# Patient Record
Sex: Male | Born: 1940
Health system: Southern US, Community
[De-identification: ages and names within clinical notes are randomized; demographics above are authoritative.]

---

## 2019-10-17 DIAGNOSIS — H903 Sensorineural hearing loss, bilateral: Secondary | ICD-10-CM | POA: Diagnosis not present

## 2019-11-02 DIAGNOSIS — E785 Hyperlipidemia, unspecified: Secondary | ICD-10-CM | POA: Diagnosis not present

## 2019-11-02 DIAGNOSIS — N1831 Chronic kidney disease, stage 3a: Secondary | ICD-10-CM | POA: Diagnosis not present

## 2019-11-02 DIAGNOSIS — Z125 Encounter for screening for malignant neoplasm of prostate: Secondary | ICD-10-CM | POA: Diagnosis not present

## 2019-11-02 DIAGNOSIS — R739 Hyperglycemia, unspecified: Secondary | ICD-10-CM | POA: Diagnosis not present

## 2019-11-13 DIAGNOSIS — I10 Essential (primary) hypertension: Secondary | ICD-10-CM | POA: Diagnosis not present

## 2019-11-13 DIAGNOSIS — R079 Chest pain, unspecified: Secondary | ICD-10-CM | POA: Diagnosis not present

## 2019-11-13 DIAGNOSIS — E785 Hyperlipidemia, unspecified: Secondary | ICD-10-CM | POA: Diagnosis not present

## 2020-04-03 DIAGNOSIS — N1831 Chronic kidney disease, stage 3a: Secondary | ICD-10-CM | POA: Diagnosis not present

## 2020-04-03 DIAGNOSIS — E785 Hyperlipidemia, unspecified: Secondary | ICD-10-CM | POA: Diagnosis not present

## 2020-04-03 DIAGNOSIS — R739 Hyperglycemia, unspecified: Secondary | ICD-10-CM | POA: Diagnosis not present

## 2020-04-17 DIAGNOSIS — N1831 Chronic kidney disease, stage 3a: Secondary | ICD-10-CM | POA: Diagnosis not present

## 2020-04-17 DIAGNOSIS — Z125 Encounter for screening for malignant neoplasm of prostate: Secondary | ICD-10-CM | POA: Diagnosis not present

## 2020-04-17 DIAGNOSIS — E785 Hyperlipidemia, unspecified: Secondary | ICD-10-CM | POA: Diagnosis not present

## 2020-04-17 DIAGNOSIS — I129 Hypertensive chronic kidney disease with stage 1 through stage 4 chronic kidney disease, or unspecified chronic kidney disease: Secondary | ICD-10-CM | POA: Diagnosis not present

## 2020-04-17 DIAGNOSIS — R739 Hyperglycemia, unspecified: Secondary | ICD-10-CM | POA: Diagnosis not present

## 2020-05-21 DIAGNOSIS — E039 Hypothyroidism, unspecified: Secondary | ICD-10-CM | POA: Diagnosis not present

## 2020-08-12 DIAGNOSIS — Z0100 Encounter for examination of eyes and vision without abnormal findings: Secondary | ICD-10-CM | POA: Diagnosis not present

## 2020-08-12 DIAGNOSIS — H2513 Age-related nuclear cataract, bilateral: Secondary | ICD-10-CM | POA: Diagnosis not present

## 2020-10-08 DIAGNOSIS — I10 Essential (primary) hypertension: Secondary | ICD-10-CM | POA: Diagnosis not present

## 2020-10-08 DIAGNOSIS — E785 Hyperlipidemia, unspecified: Secondary | ICD-10-CM | POA: Diagnosis not present

## 2020-10-08 DIAGNOSIS — N1831 Chronic kidney disease, stage 3a: Secondary | ICD-10-CM | POA: Diagnosis not present

## 2020-10-08 DIAGNOSIS — K219 Gastro-esophageal reflux disease without esophagitis: Secondary | ICD-10-CM | POA: Diagnosis not present

## 2020-10-08 DIAGNOSIS — R739 Hyperglycemia, unspecified: Secondary | ICD-10-CM | POA: Diagnosis not present

## 2020-10-15 DIAGNOSIS — I129 Hypertensive chronic kidney disease with stage 1 through stage 4 chronic kidney disease, or unspecified chronic kidney disease: Secondary | ICD-10-CM | POA: Diagnosis not present

## 2020-10-15 DIAGNOSIS — Z23 Encounter for immunization: Secondary | ICD-10-CM | POA: Diagnosis not present

## 2020-10-15 DIAGNOSIS — Z125 Encounter for screening for malignant neoplasm of prostate: Secondary | ICD-10-CM | POA: Diagnosis not present

## 2020-10-15 DIAGNOSIS — Z0000 Encounter for general adult medical examination without abnormal findings: Secondary | ICD-10-CM | POA: Diagnosis not present

## 2020-10-15 DIAGNOSIS — E785 Hyperlipidemia, unspecified: Secondary | ICD-10-CM | POA: Diagnosis not present

## 2020-10-15 DIAGNOSIS — N1831 Chronic kidney disease, stage 3a: Secondary | ICD-10-CM | POA: Diagnosis not present

## 2020-10-15 DIAGNOSIS — R739 Hyperglycemia, unspecified: Secondary | ICD-10-CM | POA: Diagnosis not present

## 2020-10-21 DIAGNOSIS — H903 Sensorineural hearing loss, bilateral: Secondary | ICD-10-CM | POA: Diagnosis not present

## 2020-11-05 DIAGNOSIS — I10 Essential (primary) hypertension: Secondary | ICD-10-CM | POA: Diagnosis not present

## 2020-11-05 DIAGNOSIS — E039 Hypothyroidism, unspecified: Secondary | ICD-10-CM | POA: Diagnosis not present

## 2021-01-11 DIAGNOSIS — Z20822 Contact with and (suspected) exposure to covid-19: Secondary | ICD-10-CM | POA: Diagnosis not present

## 2021-01-13 DIAGNOSIS — U071 COVID-19: Secondary | ICD-10-CM | POA: Diagnosis not present

## 2021-02-16 ENCOUNTER — Other Ambulatory Visit: Payer: Self-pay

## 2021-02-16 ENCOUNTER — Ambulatory Visit: Payer: Medicare HMO | Admitting: Dermatology

## 2021-02-16 ENCOUNTER — Encounter: Payer: Self-pay | Admitting: Dermatology

## 2021-02-16 DIAGNOSIS — D1800 Hemangioma unspecified site: Secondary | ICD-10-CM | POA: Diagnosis not present

## 2021-02-16 DIAGNOSIS — L821 Other seborrheic keratosis: Secondary | ICD-10-CM | POA: Diagnosis not present

## 2021-02-16 DIAGNOSIS — L918 Other hypertrophic disorders of the skin: Secondary | ICD-10-CM | POA: Diagnosis not present

## 2021-02-16 DIAGNOSIS — L814 Other melanin hyperpigmentation: Secondary | ICD-10-CM | POA: Diagnosis not present

## 2021-02-16 DIAGNOSIS — D229 Melanocytic nevi, unspecified: Secondary | ICD-10-CM | POA: Diagnosis not present

## 2021-02-16 DIAGNOSIS — Z1283 Encounter for screening for malignant neoplasm of skin: Secondary | ICD-10-CM

## 2021-02-16 DIAGNOSIS — L570 Actinic keratosis: Secondary | ICD-10-CM | POA: Diagnosis not present

## 2021-02-16 DIAGNOSIS — L578 Other skin changes due to chronic exposure to nonionizing radiation: Secondary | ICD-10-CM | POA: Diagnosis not present

## 2021-02-16 NOTE — Patient Instructions (Addendum)

## 2021-02-16 NOTE — Progress Notes (Signed)
   New Patient Visit  Subjective  Ronald Jacobson is a 80 y.o. male who presents for the following: TBSE (Patient here for full body skin exam and skin cancer screening. Patient does complain of some flaking at top of scalp and has used rx creams in the past but does not remember which ones. Patient does not have a hx of skin cancer. ). The patient presents for Total-Body Skin Exam (TBSE) for skin cancer screening and mole check.  The following portions of the chart were reviewed this encounter and updated as appropriate:   Allergies  Meds  Problems  Med Hx  Surg Hx  Fam Hx     Review of Systems:  No other skin or systemic complaints except as noted in HPI or Assessment and Plan.  Objective  Well appearing patient in no apparent distress; mood and affect are within normal limits.  A full examination was performed including scalp, head, eyes, ears, nose, lips, neck, chest, axillae, abdomen, back, buttocks, bilateral upper extremities, bilateral lower extremities, hands, feet, fingers, toes, fingernails, and toenails. All findings within normal limits unless otherwise noted below.  Scalp (18) Erythematous thin papules/macules with gritty scale.    Assessment & Plan  AK (actinic keratosis) (18) Scalp  Destruction of lesion - Scalp Complexity: simple   Destruction method: cryotherapy   Informed consent: discussed and consent obtained   Timeout:  patient name, date of birth, surgical site, and procedure verified Lesion destroyed using liquid nitrogen: Yes   Region frozen until ice ball extended beyond lesion: Yes   Outcome: patient tolerated procedure well with no complications   Post-procedure details: wound care instructions given    Skin cancer screening  Lentigines - Scattered tan macules - Due to sun exposure - Benign-appearing, observe - Recommend daily broad spectrum sunscreen SPF 30+ to sun-exposed areas, reapply every 2 hours as needed. - Call for any  changes  Seborrheic Keratoses - Stuck-on, waxy, tan-brown papules and/or plaques  - Benign-appearing - Discussed benign etiology and prognosis. - Observe - Call for any changes  Melanocytic Nevi - Tan-brown and/or pink-flesh-colored symmetric macules and papules - Benign appearing on exam today - Observation - Call clinic for new or changing moles - Recommend daily use of broad spectrum spf 30+ sunscreen to sun-exposed areas.   Hemangiomas - Red papules - Discussed benign nature - Observe - Call for any changes  Actinic Damage - Chronic condition, secondary to cumulative UV/sun exposure - diffuse scaly erythematous macules with underlying dyspigmentation - Recommend daily broad spectrum sunscreen SPF 30+ to sun-exposed areas, reapply every 2 hours as needed.  - Staying in the shade or wearing long sleeves, sun glasses (UVA+UVB protection) and wide brim hats (4-inch brim around the entire circumference of the hat) are also recommended for sun protection.  - Call for new or changing lesions.  Skin cancer screening performed today.  Acrochordons (Skin Tags) - Fleshy, skin-colored pedunculated papules - Benign appearing.  - Observe. - If desired, they can be removed with an in office procedure that is not covered by insurance. - Please call the clinic if you notice any new or changing lesions.  Return in about 6 months (around 08/16/2021) for AK follow up.  Graciella Belton, RMA, am acting as scribe for Sarina Ser, MD . Documentation: I have reviewed the above documentation for accuracy and completeness, and I agree with the above.  Sarina Ser, MD

## 2021-04-14 ENCOUNTER — Other Ambulatory Visit: Payer: Self-pay

## 2021-04-14 ENCOUNTER — Ambulatory Visit: Payer: Medicare HMO | Admitting: Nurse Practitioner

## 2021-04-14 VITALS — BP 140/80 | HR 75 | Temp 98.6°F | Ht 71.0 in | Wt 231.0 lb

## 2021-04-14 DIAGNOSIS — R051 Acute cough: Secondary | ICD-10-CM | POA: Diagnosis not present

## 2021-04-14 NOTE — Patient Instructions (Addendum)
Resume tussin DM-- increase hydrate  Chest xray   [facility] Address: [address] [place], [place], [place] [address] Phone: [phone]

## 2021-04-14 NOTE — Progress Notes (Signed)
Careteam: Patient Care Team: Adin Hector, MD as PCP - General (Internal Medicine)  Advanced Directive information    Allergies  Allergen Reactions   Nitrous Oxide Anxiety    Chief Complaint  Patient presents with   Acute Visit    Patient complains of cough for about 5-6 days. Patient took COVID test about 5 days ago that was negative. Patient bringing up a little phlegm. He has been taking Tussin DM. Cough has gotten a little better. No other respiratory symptoms. Deniw having any fever.     HPI: Patient is a 81 y.o. male seen in today at the [facility] for cough for 4-5 days.  Has been taking tussin DM 12 hour liquid for several days which has helped but still ongoing.  Has a little phlegm.  COVID test negative.  Originally had body aches but they have improved.  Drinking plenty of water.  No shortness of breath.  Wife gave him a PM medication.  Wheezing at the end of his breaths. No hx of smoking, COPD or asthma.   Review of Systems:  Review of Systems  Constitutional:  Positive for malaise/fatigue. Negative for chills and fever.  Respiratory:  Positive for cough and wheezing. Negative for sputum production and shortness of breath.   Neurological:  Negative for dizziness and headaches.   No past medical history on file. No past surgical history on file. Social History:   has no history on file for tobacco use, alcohol use, and drug use.  No family history on file.  Medications: Patient's Medications  New Prescriptions   No medications on file  Previous Medications   AMLODIPINE (NORVASC) 5 MG TABLET       FAMOTIDINE (PEPCID) 40 MG TABLET    Take by mouth.   METOPROLOL SUCCINATE (TOPROL-XL) 50 MG 24 HR TABLET    Take 1 tablet by mouth daily.   OLMESARTAN (BENICAR) 40 MG TABLET    Take 1 tablet by mouth daily.   POTASSIUM CHLORIDE SA (KLOR-CON) 20 MEQ TABLET    Take 1 tablet by mouth 2 (two) times daily.   TRIAMTERENE-HYDROCHLOROTHIAZIDE  (MAXZIDE-25) 37.5-25 MG TABLET    Take 1 tablet by mouth daily.  Modified Medications   No medications on file  Discontinued Medications   No medications on file    Physical Exam:  Vitals:   04/14/21 1555  BP: 140/80  Pulse: 75  Temp: 98.6 F (37 C)  TempSrc: Oral  SpO2: 94%  Weight: 231 lb (104.8 kg)  Height: 5\' 11"  (1.803 m)   Body mass index is 32.22 kg/m. Wt Readings from Last 3 Encounters:  04/14/21 231 lb (104.8 kg)    Physical Exam Constitutional:      Appearance: Normal appearance.  Cardiovascular:     Rate and Rhythm: Normal rate and regular rhythm.  Pulmonary:     Effort: Pulmonary effort is normal.     Breath sounds: Examination of the right-upper field reveals wheezing. Examination of the left-lower field reveals rhonchi. Wheezing and rhonchi present.  Skin:    General: Skin is warm and dry.  Neurological:     Mental Status: He is alert.    Labs reviewed: Basic Metabolic Panel: No results for input(s): NA, K, CL, CO2, GLUCOSE, BUN, CREATININE, CALCIUM, MG, PHOS, TSH in the last 8760 hours. Liver Function Tests: No results for input(s): AST, ALT, ALKPHOS, BILITOT, PROT, ALBUMIN in the last 8760 hours. No results for input(s): LIPASE, AMYLASE in the last  8760 hours. No results for input(s): AMMONIA in the last 8760 hours. CBC: No results for input(s): WBC, NEUTROABS, HGB, HCT, MCV, PLT in the last 8760 hours. Lipid Panel: No results for input(s): CHOL, HDL, LDLCALC, TRIG, CHOLHDL, LDLDIRECT in the last 8760 hours. TSH: No results for input(s): TSH in the last 8760 hours. A1C: No results found for: HGBA1C   Assessment/Plan 1. Acute cough -to resume tussin DM by mouth twice daily with full glass of water -due to abnormal lung sounds will have him get chest xray.  -strict follow up precautions given.  - DG Chest 2 View; Future  Marquize Seib K. [place], [facility] [phone]

## 2021-04-15 ENCOUNTER — Ambulatory Visit
Admission: RE | Admit: 2021-04-15 | Discharge: 2021-04-15 | Disposition: A | Discharge status: Home / Self Care | Payer: Medicare HMO | Source: Ambulatory Visit | Attending: Nurse Practitioner | Admitting: Nurse Practitioner

## 2021-04-15 ENCOUNTER — Ambulatory Visit
Admission: RE | Admit: 2021-04-15 | Discharge: 2021-04-15 | Disposition: A | Discharge status: Home / Self Care | Payer: Medicare HMO | Attending: Nurse Practitioner | Admitting: Nurse Practitioner

## 2021-04-15 ENCOUNTER — Telehealth: Payer: Self-pay | Admitting: Nurse Practitioner

## 2021-04-15 ENCOUNTER — Other Ambulatory Visit: Payer: Self-pay

## 2021-04-15 DIAGNOSIS — R051 Acute cough: Secondary | ICD-10-CM | POA: Insufficient documentation

## 2021-04-15 DIAGNOSIS — J9811 Atelectasis: Secondary | ICD-10-CM | POA: Diagnosis not present

## 2021-04-15 DIAGNOSIS — R059 Cough, unspecified: Secondary | ICD-10-CM | POA: Diagnosis not present

## 2021-04-15 NOTE — Telephone Encounter (Signed)
Spoke with patient about results.

## 2021-04-15 NOTE — Telephone Encounter (Signed)
Pt called very persist about needing his results from chest Xray done 04/15/21. He & wife are leaving for trip on 04/17/21 for 10 days & needs to make arrangements if needed.  Would like for someone to contact him with results ASAP.  Please advise, Thank you Lattie Haw

## 2021-04-30 DIAGNOSIS — U071 COVID-19: Secondary | ICD-10-CM | POA: Diagnosis not present

## 2021-04-30 DIAGNOSIS — Z03818 Encounter for observation for suspected exposure to other biological agents ruled out: Secondary | ICD-10-CM | POA: Diagnosis not present

## 2021-05-07 DIAGNOSIS — N1831 Chronic kidney disease, stage 3a: Secondary | ICD-10-CM | POA: Diagnosis not present

## 2021-05-07 DIAGNOSIS — E785 Hyperlipidemia, unspecified: Secondary | ICD-10-CM | POA: Diagnosis not present

## 2021-05-07 DIAGNOSIS — R739 Hyperglycemia, unspecified: Secondary | ICD-10-CM | POA: Diagnosis not present

## 2021-05-07 DIAGNOSIS — Z125 Encounter for screening for malignant neoplasm of prostate: Secondary | ICD-10-CM | POA: Diagnosis not present

## 2021-05-07 DIAGNOSIS — K219 Gastro-esophageal reflux disease without esophagitis: Secondary | ICD-10-CM | POA: Diagnosis not present

## 2021-05-07 DIAGNOSIS — I10 Essential (primary) hypertension: Secondary | ICD-10-CM | POA: Diagnosis not present

## 2021-05-13 DIAGNOSIS — E785 Hyperlipidemia, unspecified: Secondary | ICD-10-CM | POA: Diagnosis not present

## 2021-05-13 DIAGNOSIS — I129 Hypertensive chronic kidney disease with stage 1 through stage 4 chronic kidney disease, or unspecified chronic kidney disease: Secondary | ICD-10-CM | POA: Diagnosis not present

## 2021-05-13 DIAGNOSIS — N1831 Chronic kidney disease, stage 3a: Secondary | ICD-10-CM | POA: Diagnosis not present

## 2021-05-13 DIAGNOSIS — R739 Hyperglycemia, unspecified: Secondary | ICD-10-CM | POA: Diagnosis not present

## 2021-08-13 DIAGNOSIS — H43813 Vitreous degeneration, bilateral: Secondary | ICD-10-CM | POA: Diagnosis not present

## 2021-08-13 DIAGNOSIS — Z0100 Encounter for examination of eyes and vision without abnormal findings: Secondary | ICD-10-CM | POA: Diagnosis not present

## 2021-08-19 ENCOUNTER — Encounter: Payer: Self-pay | Admitting: Dermatology

## 2021-08-19 ENCOUNTER — Ambulatory Visit: Payer: Medicare HMO | Admitting: Dermatology

## 2021-08-19 DIAGNOSIS — L578 Other skin changes due to chronic exposure to nonionizing radiation: Secondary | ICD-10-CM | POA: Diagnosis not present

## 2021-08-19 DIAGNOSIS — L570 Actinic keratosis: Secondary | ICD-10-CM | POA: Diagnosis not present

## 2021-08-19 MED ORDER — FLUOROURACIL 5 % EX CREA: TOPICAL_CREAM | CUTANEOUS | 1 refills | Status: AC

## 2021-08-19 NOTE — Patient Instructions (Signed)
Cryotherapy Aftercare ? ?Wash gently with soap and water everyday.   ?Apply Vaseline and Band-Aid daily until healed.  ? ?Prior to procedure, discussed risks of blister formation, small wound, skin dyspigmentation, or rare scar following cryotherapy. Recommend Vaseline ointment to treated areas while healing.  ? ? ?Start 5-fluorouracil/calcipotriene cream twice a day for 7 days to affected areas including scalp. Prescription sent to [facility] Patient provided with contact information for pharmacy and advised the pharmacy will mail the prescription to their home. Patient provided with handout reviewing treatment course and side effects and advised to call or message [place] on MyChart with any concerns. ? ?5-Fluorouracil/Calcipotriene Patient Education  ? ?Actinic keratoses are the dry, red scaly spots on the skin caused by sun damage. A portion of these spots can turn into skin cancer with time, and treating them can help prevent development of skin cancer.  ? ?Treatment of these spots requires removal of the defective skin cells. There are various ways to remove actinic keratoses, including freezing with liquid nitrogen, treatment with creams, or treatment with a blue light procedure in the office.  ? ?5-fluorouracil cream is a topical cream used to treat actinic keratoses. It works by interfering with the growth of abnormal fast-growing skin cells, such as actinic keratoses. These cells peel off and are replaced by healthy ones.  ? ?5-fluorouracil/calcipotriene is a combination of the 5-fluorouracil cream with a vitamin D analog cream called calcipotriene. The calcipotriene alone does not treat actinic keratoses. However, when it is combined with 5-fluorouracil, it helps the 5-fluorouracil treat the actinic keratoses much faster so that the same results can be achieved with a much shorter treatment time. ? ?INSTRUCTIONS FOR 5-FLUOROURACIL/CALCIPOTRIENE CREAM:  ? ?5-fluorouracil/calcipotriene cream typically  only needs to be used for 4-7 days. A thin layer should be applied twice a day to the treatment areas recommended by your physician.  ? ?If your physician prescribed you separate tubes of 5-fluourouracil and calcipotriene, apply a thin layer of 5-fluorouracil followed by a thin layer of calcipotriene.  ? ?Avoid contact with your eyes, nostrils, and mouth. Do not use 5-fluorouracil/calcipotriene cream on infected or open wounds.  ? ?You will develop redness, irritation and some crusting at areas where you have pre-cancer damage/actinic keratoses. IF YOU DEVELOP PAIN, BLEEDING, OR SIGNIFICANT CRUSTING, STOP THE TREATMENT EARLY - you have already gotten a good response and the actinic keratoses should clear up well. ? ?Wash your hands after applying 5-fluorouracil 5% cream on your skin.  ? ?A moisturizer or sunscreen with a minimum SPF 30 should be applied each morning.  ? ?Once you have finished the treatment, you can apply a thin layer of Vaseline twice a day to irritated areas to soothe and calm the areas more quickly. If you experience significant discomfort, contact your physician. ? ?For some patients it is necessary to repeat the treatment for best results. ? ?SIDE EFFECTS: When using 5-fluorouracil/calcipotriene cream, you may have mild irritation, such as redness, dryness, swelling, or a mild burning sensation. This usually resolves within 2 weeks. The more actinic keratoses you have, the more redness and inflammation you can expect during treatment. Eye irritation has been reported rarely. If this occurs, please let us know.  ?If you have any trouble using this cream, please call the office. If you have any other questions about this information, please do not hesitate to ask me before you leave the office. ? ? ?If You Need Anything After Your Visit ? ?If you have any questions  or concerns for your doctor, please call our main line at [phone] and press option 4 to reach your doctor's medical assistant.  If no one answers, please leave a voicemail as directed and we will return your call as soon as possible. Messages left after 4 pm will be answered the following business day.  ? ?You may also send [place] a message via MyChart. We typically respond to MyChart messages within 1-2 business days. ? ?For prescription refills, please ask your pharmacy to contact our office. Our fax number is [phone]. ? ?If you have an urgent issue when the clinic is closed that cannot wait until the next business day, you can page your doctor at the number below.   ? ?Please note that while we do our best to be available for urgent issues outside of office hours, we are not available 24/7.  ? ?If you have an urgent issue and are unable to reach [place], you may choose to seek medical care at your doctor's office, retail clinic, urgent care center, or emergency room. ? ?If you have a medical emergency, please immediately call 911 or go to the emergency department. ? ?Pager Numbers ? ?- Dr. Nehemiah Massed: [phone] ? ?- Dr. Laurence Ferrari: [phone] ? ?- Dr. Nicole Kindred: [phone] ? ?In the event of inclement weather, please call our main line at [phone] for an update on the status of any delays or closures. ? ?Dermatology Medication Tips: ?Please keep the boxes that topical medications come in in order to help keep track of the instructions about where and how to use these. Pharmacies typically print the medication instructions only on the boxes and not directly on the medication tubes.  ? ?If your medication is too expensive, please contact our office at [phone] option 4 or send [place] a message through [place].  ? ?We are unable to tell what your co-pay for medications will be in advance as this is different depending on your insurance coverage. However, we may be able to find a substitute medication at lower cost or fill out paperwork to get insurance to cover a needed medication.  ? ?If a prior authorization is required to get your medication  covered by your insurance company, please allow [place] 1-2 business days to complete this process. ? ?Drug prices often vary depending on where the prescription is filled and some pharmacies may offer cheaper prices. ? ?The website www.goodrx.com contains coupons for medications through different pharmacies. The prices here do not account for what the cost may be with help from insurance (it may be cheaper with your insurance), but the website can give you the price if you did not use any insurance.  ?- You can print the associated coupon and take it with your prescription to the pharmacy.  ?- You may also stop by our office during regular business hours and pick up a GoodRx coupon card.  ?- If you need your prescription sent electronically to a different pharmacy, notify our office through [facility] or by phone at [phone] option 4. ? ? ? ? ?Si Usted Necesita Algo Despu?s de Su Visita ? ?Tambi?n puede enviarnos un mensaje a trav?s de MyChart. Por lo general respondemos a los mensajes de MyChart en el transcurso de 1 a 2 d?as h?biles. ? ?Para renovar recetas, por favor pida a su farmacia que se ponga en contacto con nuestra oficina. Nuestro n?mero de fax es el [phone]. ? ?Si tiene un asunto urgente cuando la cl?nica est? cerrada y que no puede  esperar hasta el siguiente d?a h?bil, puede llamar/localizar a su doctor(a) al n?mero que aparece a continuaci?n.  ? ?Por favor, tenga en cuenta que aunque hacemos todo lo posible para estar disponibles para asuntos urgentes fuera del horario de oficina, no estamos disponibles las 24 horas del d?a, los 7 d?as de la semana.  ? ?Si tiene un problema urgente y no puede comunicarse con nosotros, puede optar por buscar atenci?n m?dica  en el consultorio de su doctor(a), en una cl?nica privada, en un centro de atenci?n urgente o en una sala de emergencias. ? ?Si tiene Engineer, maintenance (IT) m?dica, por favor llame inmediatamente al 911 o vaya a la sala de  emergencias. ? ?N?meros de b?per ? ?- Dr. Nehemiah Massed: [phone] ? ?- Dra. Moye: [phone] ? ?- Dra. Nicole Kindred: [phone] ? ?En caso de inclemencias del tiempo, por favor llame a nuestra l?nea principal al [phone]

## 2021-08-19 NOTE — Progress Notes (Signed)
Follow-Up Visit   Subjective  Ronald Jacobson is a 81 y.o. male who presents for the following: Actinic Keratosis (6 month recheck. Scalp. Tx with LN2 at last visit). The patient has spots, moles and lesions to be evaluated, some may be new or changing and the patient has concerns that these could be cancer.  The following portions of the chart were reviewed this encounter and updated as appropriate:  Allergies  Meds  Problems  Med Hx  Surg Hx  Fam Hx     Review of Systems: No other skin or systemic complaints except as noted in HPI or Assessment and Plan.  Objective  Well appearing patient in no apparent distress; mood and affect are within normal limits.  A focused examination was performed including head, including the scalp, face, neck, nose, ears, eyelids, and lips. Relevant physical exam findings are noted in the Assessment and Plan.  Scalp x3 (3) Erythematous thin papules/macules with gritty scale.    Assessment & Plan   Actinic Damage - Severe, confluent actinic changes with pre-cancerous actinic keratoses  - Severe, chronic, not at goal, secondary to cumulative UV radiation exposure over time - diffuse scaly erythematous macules and papules with underlying dyspigmentation - Discussed Prescription "Field Treatment" for Severe, Chronic Confluent Actinic Changes with Pre-Cancerous Actinic Keratoses Field treatment involves treatment of an entire area of skin that has confluent Actinic Changes (Sun/ Ultraviolet light damage) and PreCancerous Actinic Keratoses by method of PhotoDynamic Therapy (PDT) and/or prescription Topical Chemotherapy agents such as 5-fluorouracil, 5-fluorouracil/calcipotriene, and/or imiquimod.  The purpose is to decrease the number of clinically evident and subclinical PreCancerous lesions to prevent progression to development of skin cancer by chemically destroying early precancer changes that may or may not be visible.  It has been shown to reduce  the risk of developing skin cancer in the treated area. As a result of treatment, redness, scaling, crusting, and open sores may occur during treatment course. One or more than one of these methods may be used and may have to be used several times to control, suppress and eliminate the PreCancerous changes. Discussed treatment course, expected reaction, and possible side effects. - Recommend daily broad spectrum sunscreen SPF 30+ to sun-exposed areas, reapply every 2 hours as needed.  - Staying in the shade or wearing long sleeves, sun glasses (UVA+UVB protection) and wide brim hats (4-inch brim around the entire circumference of the hat) are also recommended. - Call for new or changing lesions.   Start 5-fluorouracil/calcipotriene cream twice a day for 7  days to affected areas including scalp. Prescription sent to [facility]. Patient provided with contact information for pharmacy and advised the pharmacy will mail the prescription to their home. Patient provided with handout reviewing treatment course and side effects and advised to call or message [place] on MyChart with any concerns.  AK (actinic keratosis) (3) Scalp x3  Actinic keratoses are precancerous spots that appear secondary to cumulative UV radiation exposure/sun exposure over time. They are chronic with expected duration over 1 year. A portion of actinic keratoses will progress to squamous cell carcinoma of the skin. It is not possible to reliably predict which spots will progress to skin cancer and so treatment is recommended to prevent development of skin cancer.  Recommend daily broad spectrum sunscreen SPF 30+ to sun-exposed areas, reapply every 2 hours as needed.  Recommend staying in the shade or wearing long sleeves, sun glasses (UVA+UVB protection) and wide brim hats (4-inch brim around the entire circumference  of the hat). Call for new or changing lesions.  Start 5-fluorouracil/calcipotriene cream twice a day for 7 days to  affected areas including scalp. Prescription sent to [facility]. Patient provided with contact information for pharmacy and advised the pharmacy will mail the prescription to their home. Patient provided with handout reviewing treatment course and side effects and advised to call or message [place] on MyChart with any concerns.   Destruction of lesion - Scalp x3 Complexity: simple   Destruction method: cryotherapy   Informed consent: discussed and consent obtained   Timeout:  patient name, date of birth, surgical site, and procedure verified Lesion destroyed using liquid nitrogen: Yes   Region frozen until ice ball extended beyond lesion: Yes   Outcome: patient tolerated procedure well with no complications   Post-procedure details: wound care instructions given    fluorouracil (EFUDEX) 5 % cream - Scalp x3 Apply twice a day for 7 days to affected areas including scalp.   Return in about 6 months (around 02/19/2022) for AK Follow Up.  I, Emelia Salisbury, CMA, am acting as scribe for Sarina Ser, MD. Documentation: I have reviewed the above documentation for accuracy and completeness, and I agree with the above.  Sarina Ser, MD

## 2021-09-01 ENCOUNTER — Encounter: Payer: Self-pay | Admitting: Dermatology

## 2021-11-04 DIAGNOSIS — N1831 Chronic kidney disease, stage 3a: Secondary | ICD-10-CM | POA: Diagnosis not present

## 2021-11-04 DIAGNOSIS — E785 Hyperlipidemia, unspecified: Secondary | ICD-10-CM | POA: Diagnosis not present

## 2021-11-04 DIAGNOSIS — R739 Hyperglycemia, unspecified: Secondary | ICD-10-CM | POA: Diagnosis not present

## 2021-11-11 DIAGNOSIS — N183 Chronic kidney disease, stage 3 unspecified: Secondary | ICD-10-CM | POA: Diagnosis not present

## 2021-11-11 DIAGNOSIS — Z1389 Encounter for screening for other disorder: Secondary | ICD-10-CM | POA: Diagnosis not present

## 2021-11-11 DIAGNOSIS — R7303 Prediabetes: Secondary | ICD-10-CM | POA: Diagnosis not present

## 2021-11-11 DIAGNOSIS — E785 Hyperlipidemia, unspecified: Secondary | ICD-10-CM | POA: Diagnosis not present

## 2021-11-11 DIAGNOSIS — Z0000 Encounter for general adult medical examination without abnormal findings: Secondary | ICD-10-CM | POA: Diagnosis not present

## 2021-11-11 DIAGNOSIS — I129 Hypertensive chronic kidney disease with stage 1 through stage 4 chronic kidney disease, or unspecified chronic kidney disease: Secondary | ICD-10-CM | POA: Diagnosis not present

## 2021-11-22 DIAGNOSIS — M25562 Pain in left knee: Secondary | ICD-10-CM | POA: Diagnosis not present

## 2022-02-26 DIAGNOSIS — I10 Essential (primary) hypertension: Secondary | ICD-10-CM | POA: Diagnosis not present

## 2022-02-26 DIAGNOSIS — M10071 Idiopathic gout, right ankle and foot: Secondary | ICD-10-CM | POA: Diagnosis not present

## 2022-03-15 ENCOUNTER — Ambulatory Visit: Payer: Medicare HMO | Admitting: Dermatology

## 2022-03-15 DIAGNOSIS — Z5111 Encounter for antineoplastic chemotherapy: Secondary | ICD-10-CM

## 2022-03-15 DIAGNOSIS — L570 Actinic keratosis: Secondary | ICD-10-CM | POA: Diagnosis not present

## 2022-03-15 DIAGNOSIS — L578 Other skin changes due to chronic exposure to nonionizing radiation: Secondary | ICD-10-CM

## 2022-03-15 DIAGNOSIS — Z79899 Other long term (current) drug therapy: Secondary | ICD-10-CM

## 2022-03-15 NOTE — Progress Notes (Signed)
Follow-Up Visit   Subjective  Ronald Jacobson is a 81 y.o. male who presents for the following: Actinic Keratosis (Hx of aks 6 month follow up. Patient use 5 f/u cream at scalp 6 months ago but still feels some rough areas at scalp.). The patient has spots, moles and lesions to be evaluated, some may be new or changing and the patient has concerns that these could be cancer.  The following portions of the chart were reviewed this encounter and updated as appropriate:  Allergies  Meds  Problems  Med Hx  Surg Hx  Fam Hx     Review of Systems: No other skin or systemic complaints except as noted in HPI or Assessment and Plan.  Objective  Well appearing patient in no apparent distress; mood and affect are within normal limits.  A focused examination was performed including face, chest . Relevant physical exam findings are noted in the Assessment and Plan.  Scalp x 5 (5) Erythematous thin papules/macules with gritty scale.    Assessment & Plan  Actinic keratosis (5) Scalp x 5  Actinic keratoses are precancerous spots that appear secondary to cumulative UV radiation exposure/sun exposure over time. They are chronic with expected duration over 1 year. A portion of actinic keratoses will progress to squamous cell carcinoma of the skin. It is not possible to reliably predict which spots will progress to skin cancer and so treatment is recommended to prevent development of skin cancer.  Recommend daily broad spectrum sunscreen SPF 30+ to sun-exposed areas, reapply every 2 hours as needed.  Recommend staying in the shade or wearing long sleeves, sun glasses (UVA+UVB protection) and wide brim hats (4-inch brim around the entire circumference of the hat). Call for new or changing lesions.  Recommend restarting 54fu cream to affected areas of scalp twice daily for 7 days.   [facility] rx sent to [facility]  Instructions for [facility] Medications  One or more of your medications  was sent to the [facility] mail order compounding pharmacy. You will receive an email from them and can purchase the medicine through that link. It will then be mailed to your home at the address you confirmed. If for any reason you do not receive an email from them, please check your spam folder. If you still do not find the email, please let [place]. [facility] phone number is [phone]   Destruction of lesion - Scalp x 5 Complexity: simple   Destruction method: cryotherapy   Informed consent: discussed and consent obtained   Timeout:  patient name, date of birth, surgical site, and procedure verified Lesion destroyed using liquid nitrogen: Yes   Region frozen until ice ball extended beyond lesion: Yes   Outcome: patient tolerated procedure well with no complications   Post-procedure details: wound care instructions given   Additional details:  Prior to procedure, discussed risks of blister formation, small wound, skin dyspigmentation, or rare scar following cryotherapy. Recommend Vaseline ointment to treated areas while healing.   Actinic Damage with PreCancerous Actinic Keratoses Counseling for Topical Chemotherapy Management: Patient exhibits: - Severe, confluent actinic changes with pre-cancerous actinic keratoses that is secondary to cumulative UV radiation exposure over time - Condition that is severe; chronic, not at goal. - diffuse scaly erythematous macules and papules with underlying dyspigmentation - Discussed Prescription "Field Treatment" topical Chemotherapy for Severe, Chronic Confluent Actinic Changes with Pre-Cancerous Actinic Keratoses Field treatment involves treatment of an entire area of skin that has confluent Actinic Changes (  Sun/ Ultraviolet light damage) and PreCancerous Actinic Keratoses by method of PhotoDynamic Therapy (PDT) and/or prescription Topical Chemotherapy agents such as 5-fluorouracil, 5-fluorouracil/calcipotriene, and/or imiquimod.  The  purpose is to decrease the number of clinically evident and subclinical PreCancerous lesions to prevent progression to development of skin cancer by chemically destroying early precancer changes that may or may not be visible.  It has been shown to reduce the risk of developing skin cancer in the treated area. As a result of treatment, redness, scaling, crusting, and open sores may occur during treatment course. One or more than one of these methods may be used and may have to be used several times to control, suppress and eliminate the PreCancerous changes. Discussed treatment course, expected reaction, and possible side effects. - Recommend daily broad spectrum sunscreen SPF 30+ to sun-exposed areas, reapply every 2 hours as needed.  - Staying in the shade or wearing long sleeves, sun glasses (UVA+UVB protection) and wide brim hats (4-inch brim around the entire circumference of the hat) are also recommended. - Call for new or changing lesions.  - Restart  5-fluorouracil/calcipotriene cream twice a day for 7 days to affected areas including scalp. Prescription sent to [facility]. Patient advised they will receive an email to purchase the medication online and have it sent to their home. Patient provided with handout reviewing treatment course and side effects and advised to call or message [place] on MyChart with any concerns.  Reviewed course of treatment and expected reaction.  Patient advised to expect inflammation and crusting and advised that erosions are possible.  Patient advised to be diligent with sun protection during and after treatment. Counseled to keep medication out of reach of children and pets.  Return in about 1 year (around 03/16/2023) for TBSE.  IRuthell Rummage, CMA, am acting as scribe for Sarina Ser, MD.. Documentation: I have reviewed the above documentation for accuracy and completeness, and I agree with the above.  Sarina Ser, MD

## 2022-03-15 NOTE — Patient Instructions (Addendum)
Restart cream in January    - Start 5-fluorouracil/calcipotriene cream twice a day for 7 days to affected areas including scalp. Prescription sent to [facility]. Patient advised they will receive an email to purchase the medication online and have it sent to their home. Patient provided with handout reviewing treatment course and side effects and advised to call or message [place] on MyChart with any concerns.  Reviewed course of treatment and expected reaction.  Patient advised to expect inflammation and crusting and advised that erosions are possible.  Patient advised to be diligent with sun protection during and after treatment. Counseled to keep medication out of reach of children and pets.   Instructions for [facility] Medications  One or more of your medications was sent to the [facility] mail order compounding pharmacy. You will receive an email from them and can purchase the medicine through that link. It will then be mailed to your home at the address you confirmed. If for any reason you do not receive an email from them, please check your spam folder. If you still do not find the email, please let us know. [facility] phone number is [phone].     5-Fluorouracil/Calcipotriene Patient Education   Actinic keratoses are the dry, red scaly spots on the skin caused by sun damage. A portion of these spots can turn into skin cancer with time, and treating them can help prevent development of skin cancer.   Treatment of these spots requires removal of the defective skin cells. There are various ways to remove actinic keratoses, including freezing with liquid nitrogen, treatment with creams, or treatment with a blue light procedure in the office.   5-fluorouracil cream is a topical cream used to treat actinic keratoses. It works by interfering with the growth of abnormal fast-growing skin cells, such as actinic keratoses. These cells peel off and are  replaced by healthy ones.   5-fluorouracil/calcipotriene is a combination of the 5-fluorouracil cream with a vitamin D analog cream called calcipotriene. The calcipotriene alone does not treat actinic keratoses. However, when it is combined with 5-fluorouracil, it helps the 5-fluorouracil treat the actinic keratoses much faster so that the same results can be achieved with a much shorter treatment time.  INSTRUCTIONS FOR 5-FLUOROURACIL/CALCIPOTRIENE CREAM:   5-fluorouracil/calcipotriene cream typically only needs to be used for 4-7 days. A thin layer should be applied twice a day to the treatment areas recommended by your physician.   If your physician prescribed you separate tubes of 5-fluourouracil and calcipotriene, apply a thin layer of 5-fluorouracil followed by a thin layer of calcipotriene.   Avoid contact with your eyes, nostrils, and mouth. Do not use 5-fluorouracil/calcipotriene cream on infected or open wounds.   You will develop redness, irritation and some crusting at areas where you have pre-cancer damage/actinic keratoses. IF YOU DEVELOP PAIN, BLEEDING, OR SIGNIFICANT CRUSTING, STOP THE TREATMENT EARLY - you have already gotten a good response and the actinic keratoses should clear up well.  Wash your hands after applying 5-fluorouracil 5% cream on your skin.   A moisturizer or sunscreen with a minimum SPF 30 should be applied each morning.   Once you have finished the treatment, you can apply a thin layer of Vaseline twice a day to irritated areas to soothe and calm the areas more quickly. If you experience significant discomfort, contact your physician.  For some patients it is necessary to repeat the treatment for best results.  SIDE EFFECTS: When using 5-fluorouracil/calcipotriene cream, you  may have mild irritation, such as redness, dryness, swelling, or a mild burning sensation. This usually resolves within 2 weeks. The more actinic keratoses you have, the more redness and  inflammation you can expect during treatment. Eye irritation has been reported rarely. If this occurs, please let us know.  If you have any trouble using this cream, please call the office. If you have any other questions about this information, please do not hesitate to ask me before you leave the office.       Actinic keratoses are precancerous spots that appear secondary to cumulative UV radiation exposure/sun exposure over time. They are chronic with expected duration over 1 year. A portion of actinic keratoses will progress to squamous cell carcinoma of the skin. It is not possible to reliably predict which spots will progress to skin cancer and so treatment is recommended to prevent development of skin cancer.  Recommend daily broad spectrum sunscreen SPF 30+ to sun-exposed areas, reapply every 2 hours as needed.  Recommend staying in the shade or wearing long sleeves, sun glasses (UVA+UVB protection) and wide brim hats (4-inch brim around the entire circumference of the hat). Call for new or changing lesions.   Cryotherapy Aftercare  Wash gently with soap and water everyday.   Apply Vaseline and Band-Aid daily until healed.   Due to recent changes in healthcare laws, you may see results of your pathology and/or laboratory studies on MyChart before the doctors have had a chance to review them. We understand that in some cases there may be results that are confusing or concerning to you. Please understand that not all results are received at the same time and often the doctors may need to interpret multiple results in order to provide you with the best plan of care or course of treatment. Therefore, we ask that you please give [place] 2 business days to thoroughly review all your results before contacting the office for clarification. Should we see a critical lab result, you will be contacted sooner.   If You Need Anything After Your Visit  If you have any questions or concerns for your  doctor, please call our main line at [phone] and press option 4 to reach your doctor's medical assistant. If no one answers, please leave a voicemail as directed and we will return your call as soon as possible. Messages left after 4 pm will be answered the following business day.   You may also send [place] a message via [place]. We typically respond to MyChart messages within 1-2 business days.  For prescription refills, please ask your pharmacy to contact our office. Our fax number is [phone].  If you have an urgent issue when the clinic is closed that cannot wait until the next business day, you can page your doctor at the number below.    Please note that while we do our best to be available for urgent issues outside of office hours, we are not available 24/7.   If you have an urgent issue and are unable to reach [place], you may choose to seek medical care at your doctor's office, retail clinic, urgent care center, or emergency room.  If you have a medical emergency, please immediately call 911 or go to the emergency department.  Pager Numbers  - Dr. Nehemiah Massed: [phone]  - Dr. Laurence Ferrari: [phone]  - Dr. Nicole Kindred: [phone]  In the event of inclement weather, please call our main line at [phone] for an update on the status of any delays or  closures.  Dermatology Medication Tips: Please keep the boxes that topical medications come in in order to help keep track of the instructions about where and how to use these. Pharmacies typically print the medication instructions only on the boxes and not directly on the medication tubes.   If your medication is too expensive, please contact our office at [phone] option 4 or send [place] a message through [place].   We are unable to tell what your co-pay for medications will be in advance as this is different depending on your insurance coverage. However, we may be able to find a substitute medication at lower cost or fill out paperwork  to get insurance to cover a needed medication.   If a prior authorization is required to get your medication covered by your insurance company, please allow [place] 1-2 business days to complete this process.  Drug prices often vary depending on where the prescription is filled and some pharmacies may offer cheaper prices.  The website www.goodrx.com contains coupons for medications through different pharmacies. The prices here do not account for what the cost may be with help from insurance (it may be cheaper with your insurance), but the website can give you the price if you did not use any insurance.  - You can print the associated coupon and take it with your prescription to the pharmacy.  - You may also stop by our office during regular business hours and pick up a GoodRx coupon card.  - If you need your prescription sent electronically to a different pharmacy, notify our office through [facility] or by phone at [phone] option 4.     Si Usted Necesita Algo Despus de Su Visita  Tambin puede enviarnos un mensaje a travs de Pharmacist, community. Por lo general respondemos a los mensajes de MyChart en el transcurso de 1 a 2 das hbiles.  Para renovar recetas, por favor pida a su farmacia que se ponga en contacto con nuestra oficina. Harland Dingwall de fax es [place] [phone].  Si tiene un asunto urgente cuando la clnica est cerrada y que no puede esperar hasta el siguiente da hbil, puede llamar/localizar a su doctor(a) al nmero que aparece a continuacin.   Por favor, tenga en cuenta que aunque hacemos todo lo posible para estar disponibles para asuntos urgentes fuera del horario de [place], no estamos disponibles las 24 horas del da, los 7 das de la [place].   Si tiene un problema urgente y no puede comunicarse con nosotros, puede optar por buscar atencin mdica  en el consultorio de su doctor(a), en una clnica privada, en un centro de atencin urgente o en una sala de  emergencias.  Si tiene Engineering geologist, por favor llame inmediatamente al 911 o vaya a la sala de emergencias.  Nmeros de bper  - Dr. Nehemiah Massed: [phone]  - Dra. Moye: [phone]  - Dra. Nicole Kindred: [phone]  En caso de inclemencias del [place], por favor llame a Johnsie Kindred principal al [phone] para una actualizacin sobre el [place] de cualquier retraso o cierre.  Consejos para la medicacin en dermatologa: Por favor, guarde las cajas en las que vienen los medicamentos de uso tpico para ayudarle a seguir las instrucciones sobre dnde y cmo usarlos. Las farmacias generalmente imprimen las instrucciones del medicamento slo en las cajas y no directamente en los tubos del [place].   Si su medicamento es muy caro, por favor, pngase en contacto con Zigmund Daniel llamando al [phone] y presione la opcin 4 o  envenos un mensaje a travs de MyChart.   No podemos decirle cul ser su copago por los medicamentos por adelantado ya que esto es diferente dependiendo de la cobertura de su seguro. Sin embargo, es posible que podamos encontrar un medicamento sustituto a Electrical engineer un formulario para que el seguro cubra el medicamento que se considera necesario.   Si se requiere una autorizacin previa para que su compaa de seguros [place] su medicamento, por favor permtanos de 1 a 2 das hbiles para completar este proceso.  Los precios de los medicamentos varan con frecuencia dependiendo del Environmental consultant de dnde se surte la receta y alguna farmacias pueden ofrecer precios ms baratos.  El sitio web www.goodrx.com tiene cupones para medicamentos de Airline pilot. Los precios aqu no tienen en cuenta lo que podra costar con la ayuda del seguro (puede ser ms barato con su seguro), pero el sitio web puede darle el precio si no utiliz Research scientist (physical sciences).  - Puede imprimir el cupn correspondiente y llevarlo con su receta a la farmacia.  - Tambin puede pasar por  nuestra oficina durante el horario de atencin regular y Charity fundraiser una tarjeta de cupones de GoodRx.  - Si necesita que su receta se enve electrnicamente a una farmacia diferente, informe a nuestra oficina a travs de MyChart de [place] o por telfono llamando al [phone] y presione la opcin 4.

## 2022-03-28 ENCOUNTER — Encounter: Payer: Self-pay | Admitting: Dermatology

## 2022-04-06 DIAGNOSIS — J069 Acute upper respiratory infection, unspecified: Secondary | ICD-10-CM | POA: Diagnosis not present

## 2022-05-10 DIAGNOSIS — R7303 Prediabetes: Secondary | ICD-10-CM | POA: Diagnosis not present

## 2022-05-10 DIAGNOSIS — K219 Gastro-esophageal reflux disease without esophagitis: Secondary | ICD-10-CM | POA: Diagnosis not present

## 2022-05-10 DIAGNOSIS — E785 Hyperlipidemia, unspecified: Secondary | ICD-10-CM | POA: Diagnosis not present

## 2022-05-10 DIAGNOSIS — N1831 Chronic kidney disease, stage 3a: Secondary | ICD-10-CM | POA: Diagnosis not present

## 2022-05-20 DIAGNOSIS — M10479 Other secondary gout, unspecified ankle and foot: Secondary | ICD-10-CM | POA: Diagnosis not present

## 2022-05-20 DIAGNOSIS — I129 Hypertensive chronic kidney disease with stage 1 through stage 4 chronic kidney disease, or unspecified chronic kidney disease: Secondary | ICD-10-CM | POA: Diagnosis not present

## 2022-05-20 DIAGNOSIS — Z125 Encounter for screening for malignant neoplasm of prostate: Secondary | ICD-10-CM | POA: Diagnosis not present

## 2022-05-20 DIAGNOSIS — E785 Hyperlipidemia, unspecified: Secondary | ICD-10-CM | POA: Diagnosis not present

## 2022-05-20 DIAGNOSIS — N183 Chronic kidney disease, stage 3 unspecified: Secondary | ICD-10-CM | POA: Diagnosis not present

## 2022-05-20 DIAGNOSIS — R7303 Prediabetes: Secondary | ICD-10-CM | POA: Diagnosis not present

## 2022-09-27 DIAGNOSIS — Z0100 Encounter for examination of eyes and vision without abnormal findings: Secondary | ICD-10-CM | POA: Diagnosis not present

## 2022-09-27 DIAGNOSIS — H2513 Age-related nuclear cataract, bilateral: Secondary | ICD-10-CM | POA: Diagnosis not present

## 2022-09-27 DIAGNOSIS — H43813 Vitreous degeneration, bilateral: Secondary | ICD-10-CM | POA: Diagnosis not present

## 2022-10-05 ENCOUNTER — Ambulatory Visit: Payer: Medicare HMO | Admitting: Dermatology

## 2022-10-05 VITALS — BP 131/79 | HR 82

## 2022-10-05 DIAGNOSIS — L578 Other skin changes due to chronic exposure to nonionizing radiation: Secondary | ICD-10-CM

## 2022-10-05 DIAGNOSIS — L570 Actinic keratosis: Secondary | ICD-10-CM

## 2022-10-05 DIAGNOSIS — L918 Other hypertrophic disorders of the skin: Secondary | ICD-10-CM | POA: Diagnosis not present

## 2022-10-05 DIAGNOSIS — D489 Neoplasm of uncertain behavior, unspecified: Secondary | ICD-10-CM

## 2022-10-05 DIAGNOSIS — W908XXA Exposure to other nonionizing radiation, initial encounter: Secondary | ICD-10-CM | POA: Diagnosis not present

## 2022-10-05 DIAGNOSIS — L919 Hypertrophic disorder of the skin, unspecified: Secondary | ICD-10-CM

## 2022-10-05 NOTE — Progress Notes (Signed)
Follow-Up Visit   Subjective  Ronald Jacobson is a 82 y.o. male who presents for the following: painful spot under left arm that has bothered him just a couple of days Patient has another spot at under right arm he would like checked.   The following portions of the chart were reviewed this encounter and updated as appropriate: medications, allergies, medical history  Review of Systems:  No other skin or systemic complaints except as noted in HPI or Assessment and Plan.  Objective  Well appearing patient in no apparent distress; mood and affect are within normal limits.   A focused examination was performed of the following areas: Left and right axilla    Relevant exam findings are noted in the Assessment and Plan.  Left Axilla 5 mm fleshy pink papule        Assessment & Plan    Acrochordons (Skin Tags) Right axilla  - Fleshy, skin-colored pedunculated papules - Benign appearing.  - Observe. - If desired, they can be removed with an in office procedure that is not covered by insurance. - Please call the clinic if you notice any new or changing lesions.  Neoplasm of uncertain behavior Left Axilla  Epidermal / dermal shaving  Lesion diameter (cm):  0.5 Informed consent: discussed and consent obtained   Patient was prepped and draped in usual sterile fashion: Area prepped with alcohol. Anesthesia: the lesion was anesthetized in a standard fashion   Anesthetic:  1% lidocaine w/ epinephrine 1-100,000 buffered w/ 8.4% NaHCO3 Instrument used: flexible razor blade   Hemostasis achieved with: pressure, aluminum chloride and electrodesiccation   Outcome: patient tolerated procedure well   Post-procedure details: wound care instructions given   Post-procedure details comment:  Ointment and small bandage applied.   Specimen 1 - Surgical pathology Differential Diagnosis: r/o isk vs irrated skin tag   Check Margins: No  R/o isk vs irritated skin tag    ACTINIC  DAMAGE WITH PRECANCEROUS ACTINIC KERATOSES Counseling for Topical Chemotherapy Management: Patient exhibits: - Severe, confluent actinic changes with pre-cancerous actinic keratoses that is secondary to cumulative UV radiation exposure over time - Condition that is severe; chronic, not at goal. - diffuse scaly erythematous macules and papules with underlying dyspigmentation - Discussed Prescription "Field Treatment" topical Chemotherapy for Severe, Chronic Confluent Actinic Changes with Pre-Cancerous Actinic Keratoses Field treatment involves treatment of an entire area of skin that has confluent Actinic Changes (Sun/ Ultraviolet light damage) and PreCancerous Actinic Keratoses by method of PhotoDynamic Therapy (PDT) and/or prescription Topical Chemotherapy agents such as 5-fluorouracil, 5-fluorouracil/calcipotriene, and/or imiquimod.  The purpose is to decrease the number of clinically evident and subclinical PreCancerous lesions to prevent progression to development of skin cancer by chemically destroying early precancer changes that may or may not be visible.  It has been shown to reduce the risk of developing skin cancer in the treated area. As a result of treatment, redness, scaling, crusting, and open sores may occur during treatment course. One or more than one of these methods may be used and may have to be used several times to control, suppress and eliminate the PreCancerous changes. Discussed treatment course, expected reaction, and possible side effects. - Recommend daily broad spectrum sunscreen SPF 30+ to sun-exposed areas, reapply every 2 hours as needed.  - Staying in the shade or wearing long sleeves, sun glasses (UVA+UVB protection) and wide brim hats (4-inch brim around the entire circumference of the hat) are also recommended. - Call for new or changing lesions. -  Pt has used in the past , and has some at home and he may repeat treatment in fall.  Return if symptoms worsen or fail to  improve.  I, Asher Muir, CMA, am acting as scribe for Willeen Niece, MD.   Documentation: I have reviewed the above documentation for accuracy and completeness, and I agree with the above.  Willeen Niece, MD

## 2022-10-05 NOTE — Patient Instructions (Addendum)
   Biopsy Wound Care Instructions  Leave the original bandage on for 24 hours if possible.  If the bandage becomes soaked or soiled before that time, it is OK to remove it and examine the wound.  A small amount of post-operative bleeding is normal.  If excessive bleeding occurs, remove the bandage, place gauze over the site and apply continuous pressure (no peeking) over the area for 30 minutes. If this does not work, please call our clinic as soon as possible or page your doctor if it is after hours.   Once a day, cleanse the wound with soap and water. It is fine to shower. If a thick crust develops you may use a Q-tip dipped into dilute hydrogen peroxide (mix 1:1 with water) to dissolve it.  Hydrogen peroxide can slow the healing process, so use it only as needed.    After washing, apply petroleum jelly (Vaseline) or an antibiotic ointment if your doctor prescribed one for you, followed by a bandage.    For best healing, the wound should be covered with a layer of ointment at all times. If you are not able to keep the area covered with a bandage to hold the ointment in place, this may mean re-applying the ointment several times a day.  Continue this wound care until the wound has healed and is no longer open.   Itching and mild discomfort is normal during the healing process. However, if you develop pain or severe itching, please call our office.   If you have any discomfort, you can take Tylenol (acetaminophen) or ibuprofen as directed on the bottle. (Please do not take these if you have an allergy to them or cannot take them for another reason).  Some redness, tenderness and white or yellow material in the wound is normal healing.  If the area becomes very sore and red, or develops a thick yellow-green material (pus), it may be infected; please notify us.    If you have stitches, return to clinic as directed to have the stitches removed. You will continue wound care for 2-3 days after the  stitches are removed.   Wound healing continues for up to one year following surgery. It is not unusual to experience pain in the scar from time to time during the interval.  If the pain becomes severe or the scar thickens, you should notify the office.    A slight amount of redness in a scar is expected for the first six months.  After six months, the redness will fade and the scar will soften and fade.  The color difference becomes less noticeable with time.  If there are any problems, return for a post-op surgery check at your earliest convenience.  To improve the appearance of the scar, you can use silicone scar gel, cream, or sheets (such as Mederma or Serica) every night for up to one year. These are available over the counter (without a prescription).  Please call our office at [phone] for any questions or concerns.     Due to recent changes in healthcare laws, you may see results of your pathology and/or laboratory studies on MyChart before the doctors have had a chance to review them. We understand that in some cases there may be results that are confusing or concerning to you. Please understand that not all results are received at the same time and often the doctors may need to interpret multiple results in order to provide you with the best plan of   care or course of treatment. Therefore, we ask that you please give us 2 business days to thoroughly review all your results before contacting the office for clarification. Should we see a critical lab result, you will be contacted sooner.   If You Need Anything After Your Visit  If you have any questions or concerns for your doctor, please call our main line at [phone] and press option 4 to reach your doctor's medical assistant. If no one answers, please leave a voicemail as directed and we will return your call as soon as possible. Messages left after 4 pm will be answered the following business day.   You may also send us a  message via MyChart. We typically respond to MyChart messages within 1-2 business days.  For prescription refills, please ask your pharmacy to contact our office. Our fax number is [phone].  If you have an urgent issue when the clinic is closed that cannot wait until the next business day, you can page your doctor at the number below.    Please note that while we do our best to be available for urgent issues outside of office hours, we are not available 24/7.   If you have an urgent issue and are unable to reach us, you may choose to seek medical care at your doctor's office, retail clinic, urgent care center, or emergency room.  If you have a medical emergency, please immediately call 911 or go to the emergency department.  Pager Numbers  - Dr. Kowalski: [phone]  - Dr. Moye: [phone]  - Dr. Stewart: [phone]  In the event of inclement weather, please call our main line at [phone] for an update on the status of any delays or closures.  Dermatology Medication Tips: Please keep the boxes that topical medications come in in order to help keep track of the instructions about where and how to use these. Pharmacies typically print the medication instructions only on the boxes and not directly on the medication tubes.   If your medication is too expensive, please contact our office at [phone] option 4 or send us a message through MyChart.   We are unable to tell what your co-pay for medications will be in advance as this is different depending on your insurance coverage. However, we may be able to find a substitute medication at lower cost or fill out paperwork to get insurance to cover a needed medication.   If a prior authorization is required to get your medication covered by your insurance company, please allow us 1-2 business days to complete this process.  Drug prices often vary depending on where the prescription is filled and some pharmacies may offer  cheaper prices.  The website www.goodrx.com contains coupons for medications through different pharmacies. The prices here do not account for what the cost may be with help from insurance (it may be cheaper with your insurance), but the website can give you the price if you did not use any insurance.  - You can print the associated coupon and take it with your prescription to the pharmacy.  - You may also stop by our office during regular business hours and pick up a GoodRx coupon card.  - If you need your prescription sent electronically to a different pharmacy, notify our office through [facility] MyChart or by phone at [phone] option 4.     Si Usted Necesita Algo Despus de Su Visita  Tambin puede enviarnos un mensaje a travs de MyChart. Por lo general   respondemos a los mensajes de MyChart en el transcurso de 1 a 2 das hbiles.  Para renovar recetas, por favor pida a su farmacia que se ponga en contacto con nuestra oficina. Nuestro nmero de fax es el [phone].  Si tiene un asunto urgente cuando la clnica est cerrada y que no puede esperar hasta el siguiente da hbil, puede llamar/localizar a su doctor(a) al nmero que aparece a continuacin.   Por favor, tenga en cuenta que aunque hacemos todo lo posible para estar disponibles para asuntos urgentes fuera del horario de oficina, no estamos disponibles las 24 horas del da, los 7 das de la semana.   Si tiene un problema urgente y no puede comunicarse con nosotros, puede optar por buscar atencin mdica  en el consultorio de su doctor(a), en una clnica privada, en un centro de atencin urgente o en una sala de emergencias.  Si tiene una emergencia mdica, por favor llame inmediatamente al 911 o vaya a la sala de emergencias.  Nmeros de bper  - Dr. Kowalski: [phone]  - Dra. Moye: [phone]  - Dra. Stewart: [phone]  En caso de inclemencias del tiempo, por favor llame a nuestra lnea principal al  [phone] para una actualizacin sobre el estado de cualquier retraso o cierre.  Consejos para la medicacin en dermatologa: Por favor, guarde las cajas en las que vienen los medicamentos de uso tpico para ayudarle a seguir las instrucciones sobre dnde y cmo usarlos. Las farmacias generalmente imprimen las instrucciones del medicamento slo en las cajas y no directamente en los tubos del medicamento.   Si su medicamento es muy caro, por favor, pngase en contacto con nuestra oficina llamando al [phone] y presione la opcin 4 o envenos un mensaje a travs de MyChart.   No podemos decirle cul ser su copago por los medicamentos por adelantado ya que esto es diferente dependiendo de la cobertura de su seguro. Sin embargo, es posible que podamos encontrar un medicamento sustituto a menor costo o llenar un formulario para que el seguro cubra el medicamento que se considera necesario.   Si se requiere una autorizacin previa para que su compaa de seguros cubra su medicamento, por favor permtanos de 1 a 2 das hbiles para completar este proceso.  Los precios de los medicamentos varan con frecuencia dependiendo del lugar de dnde se surte la receta y alguna farmacias pueden ofrecer precios ms baratos.  El sitio web www.goodrx.com tiene cupones para medicamentos de diferentes farmacias. Los precios aqu no tienen en cuenta lo que podra costar con la ayuda del seguro (puede ser ms barato con su seguro), pero el sitio web puede darle el precio si no utiliz ningn seguro.  - Puede imprimir el cupn correspondiente y llevarlo con su receta a la farmacia.  - Tambin puede pasar por nuestra oficina durante el horario de atencin regular y recoger una tarjeta de cupones de GoodRx.  - Si necesita que su receta se enve electrnicamente a una farmacia diferente, informe a nuestra oficina a travs de MyChart de [place] o por telfono llamando al [phone] y presione la opcin 4.  

## 2022-10-11 ENCOUNTER — Telehealth: Payer: Self-pay

## 2022-10-11 NOTE — Telephone Encounter (Signed)
Advised pt of bx results/sh ?

## 2022-10-11 NOTE — Telephone Encounter (Signed)
-----   Message from Willeen Niece, MD sent at 10/11/2022  2:49 PM EDT ----- Skin , left axilla FIBROEPITHELIAL POLYP  Benign skin tag - please call patient

## 2022-11-15 DIAGNOSIS — Z125 Encounter for screening for malignant neoplasm of prostate: Secondary | ICD-10-CM | POA: Diagnosis not present

## 2022-11-15 DIAGNOSIS — M10479 Other secondary gout, unspecified ankle and foot: Secondary | ICD-10-CM | POA: Diagnosis not present

## 2022-11-15 DIAGNOSIS — N1831 Chronic kidney disease, stage 3a: Secondary | ICD-10-CM | POA: Diagnosis not present

## 2022-11-15 DIAGNOSIS — R7303 Prediabetes: Secondary | ICD-10-CM | POA: Diagnosis not present

## 2022-11-15 DIAGNOSIS — E785 Hyperlipidemia, unspecified: Secondary | ICD-10-CM | POA: Diagnosis not present

## 2022-11-15 DIAGNOSIS — K219 Gastro-esophageal reflux disease without esophagitis: Secondary | ICD-10-CM | POA: Diagnosis not present

## 2022-11-22 DIAGNOSIS — M109 Gout, unspecified: Secondary | ICD-10-CM | POA: Diagnosis not present

## 2022-11-22 DIAGNOSIS — E785 Hyperlipidemia, unspecified: Secondary | ICD-10-CM | POA: Diagnosis not present

## 2022-11-22 DIAGNOSIS — Z1331 Encounter for screening for depression: Secondary | ICD-10-CM | POA: Diagnosis not present

## 2022-11-22 DIAGNOSIS — R1013 Epigastric pain: Secondary | ICD-10-CM | POA: Diagnosis not present

## 2022-11-22 DIAGNOSIS — R7303 Prediabetes: Secondary | ICD-10-CM | POA: Diagnosis not present

## 2022-11-22 DIAGNOSIS — Z0000 Encounter for general adult medical examination without abnormal findings: Secondary | ICD-10-CM | POA: Diagnosis not present

## 2022-11-22 DIAGNOSIS — I129 Hypertensive chronic kidney disease with stage 1 through stage 4 chronic kidney disease, or unspecified chronic kidney disease: Secondary | ICD-10-CM | POA: Diagnosis not present

## 2022-11-22 DIAGNOSIS — N183 Chronic kidney disease, stage 3 unspecified: Secondary | ICD-10-CM | POA: Diagnosis not present

## 2022-12-20 DIAGNOSIS — H903 Sensorineural hearing loss, bilateral: Secondary | ICD-10-CM | POA: Diagnosis not present

## 2023-01-14 IMAGING — CR DG CHEST 2V
1 series · 2 of 2 positions shown · non-contrast
Comparison: 05/22/2015

CLINICAL DATA: Cough for 6 days, rattling in chest with inspiration

EXAM:
CHEST - 2 VIEW

[Series 1: dg chest 2 view · 0.14mm/px · 2 of 2 slices shown]
[im 1/2]
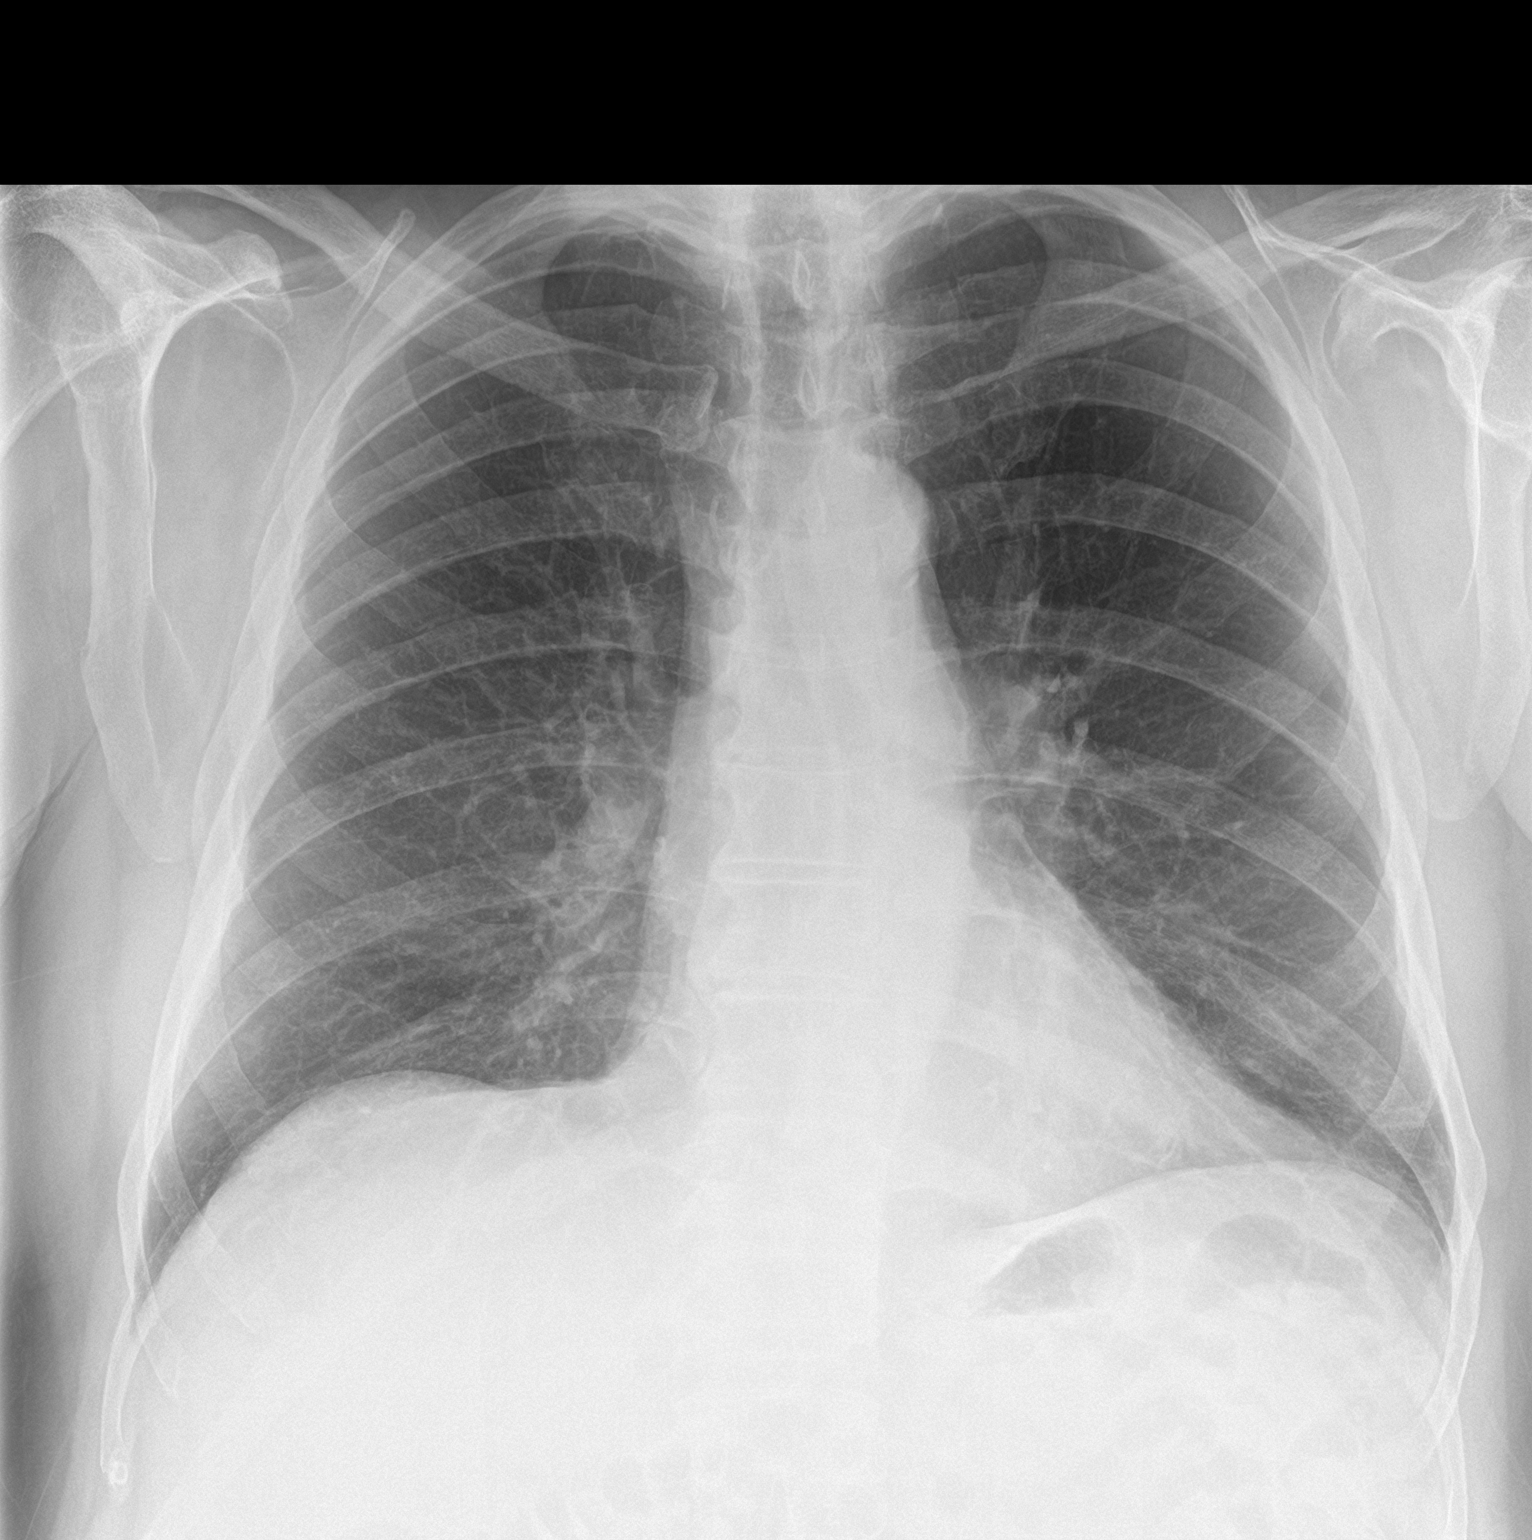
[im 2/2]
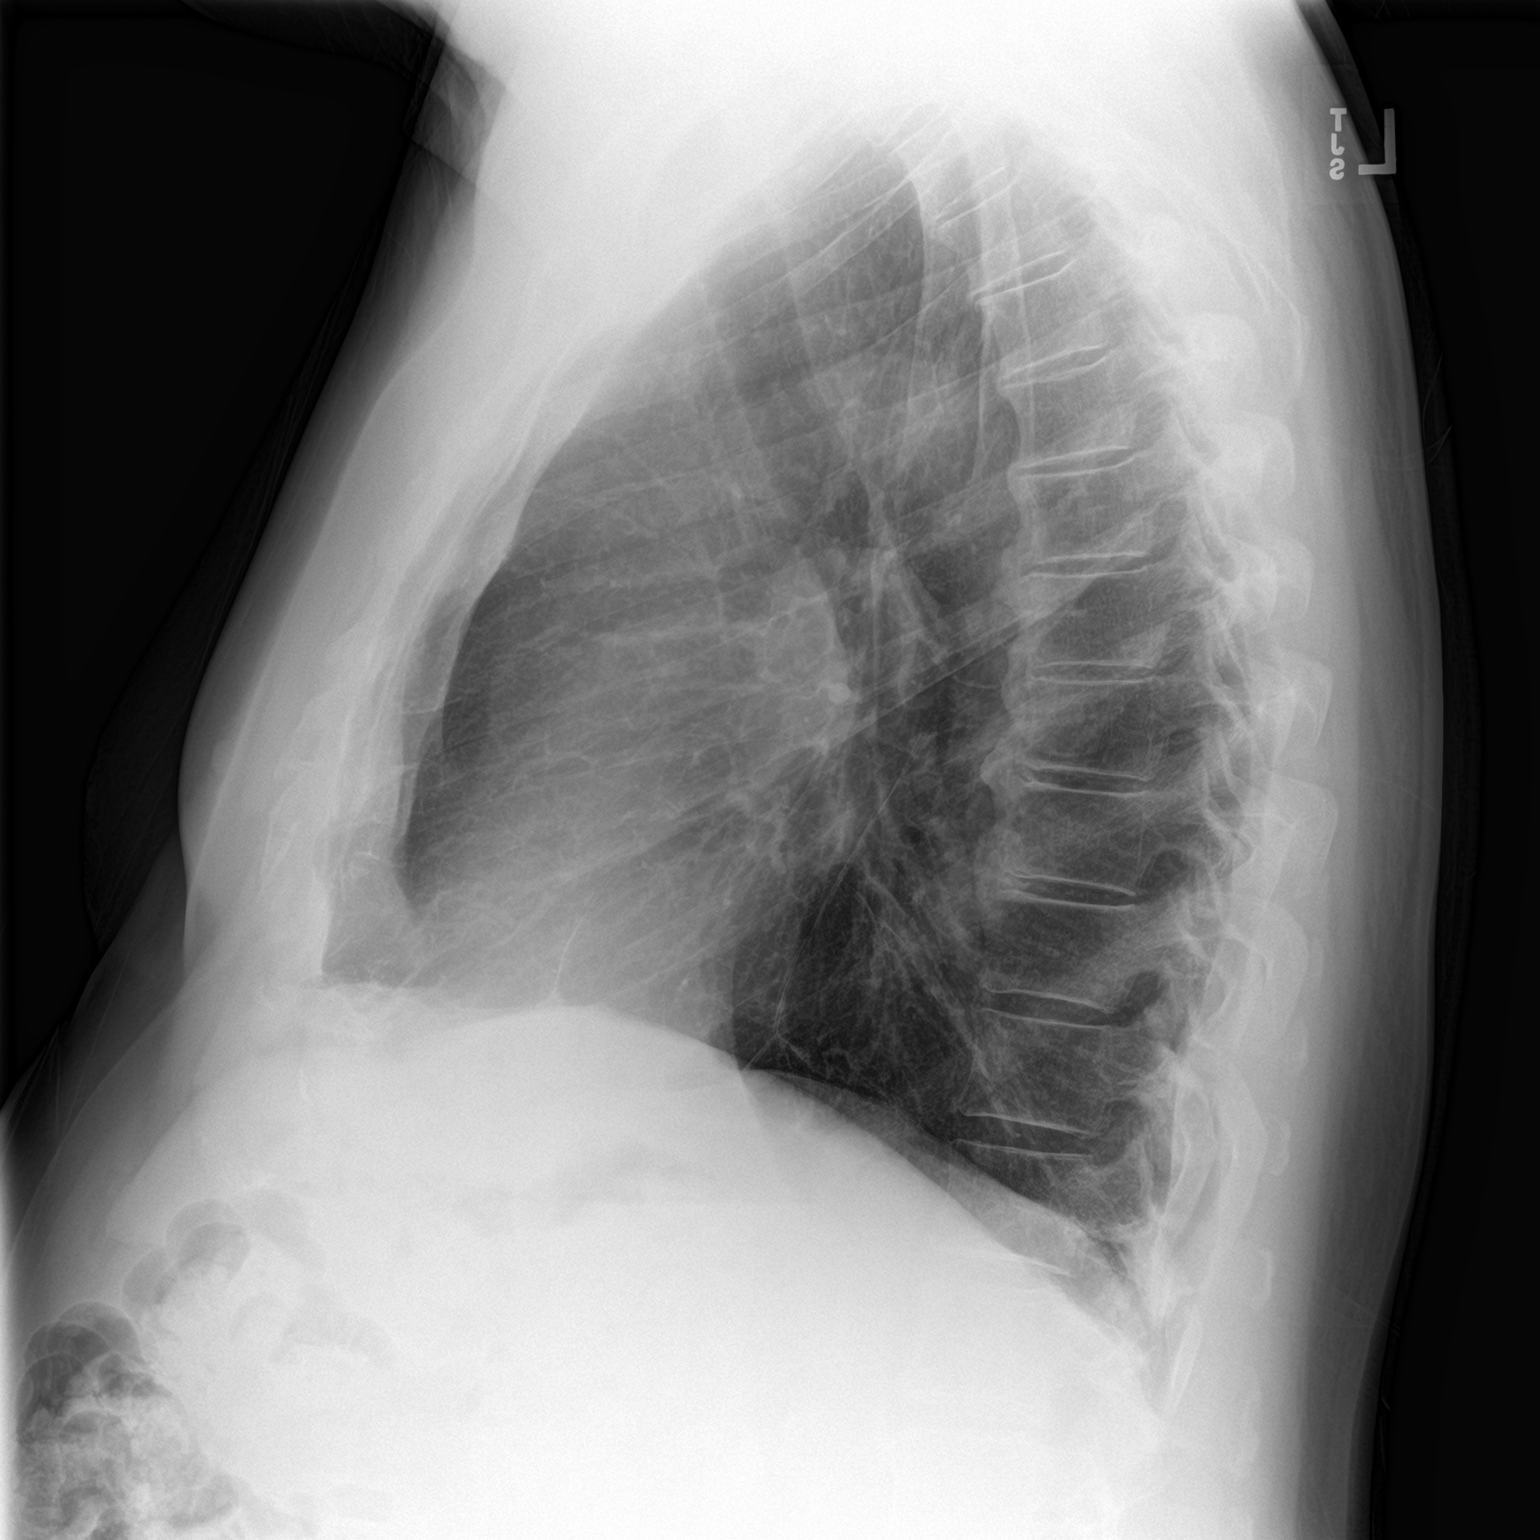

[2 of 2 positions shown; findings below may reference images not displayed]

FINDINGS: Normal heart size, mediastinal contours, and pulmonary vascularity.

Minimal LEFT basilar atelectasis.

Lungs otherwise clear.

No pulmonary infiltrate, pleural effusion, or pneumothorax.

Osseous structures unremarkable.
IMPRESSION: No acute abnormalities.

## 2023-03-24 ENCOUNTER — Ambulatory Visit: Payer: Medicare HMO | Admitting: Dermatology

## 2023-03-24 ENCOUNTER — Encounter: Payer: Self-pay | Admitting: Dermatology

## 2023-03-24 DIAGNOSIS — W908XXA Exposure to other nonionizing radiation, initial encounter: Secondary | ICD-10-CM

## 2023-03-24 DIAGNOSIS — Z7189 Other specified counseling: Secondary | ICD-10-CM

## 2023-03-24 DIAGNOSIS — L814 Other melanin hyperpigmentation: Secondary | ICD-10-CM | POA: Diagnosis not present

## 2023-03-24 DIAGNOSIS — L821 Other seborrheic keratosis: Secondary | ICD-10-CM

## 2023-03-24 DIAGNOSIS — L570 Actinic keratosis: Secondary | ICD-10-CM

## 2023-03-24 DIAGNOSIS — Z872 Personal history of diseases of the skin and subcutaneous tissue: Secondary | ICD-10-CM

## 2023-03-24 DIAGNOSIS — Z5111 Encounter for antineoplastic chemotherapy: Secondary | ICD-10-CM

## 2023-03-24 DIAGNOSIS — L578 Other skin changes due to chronic exposure to nonionizing radiation: Secondary | ICD-10-CM | POA: Diagnosis not present

## 2023-03-24 DIAGNOSIS — D1801 Hemangioma of skin and subcutaneous tissue: Secondary | ICD-10-CM | POA: Diagnosis not present

## 2023-03-24 DIAGNOSIS — L820 Inflamed seborrheic keratosis: Secondary | ICD-10-CM

## 2023-03-24 DIAGNOSIS — Z1283 Encounter for screening for malignant neoplasm of skin: Secondary | ICD-10-CM

## 2023-03-24 DIAGNOSIS — Z79899 Other long term (current) drug therapy: Secondary | ICD-10-CM

## 2023-03-24 DIAGNOSIS — D229 Melanocytic nevi, unspecified: Secondary | ICD-10-CM

## 2023-03-24 NOTE — Patient Instructions (Addendum)
Cryotherapy Aftercare  Wash gently with soap and water everyday.   Apply Vaseline and Band-Aid daily until healed.   Starting in January -  - Start 5-fluorouracil/calcipotriene cream twice a day for 7-10 days to affected areas including top of scalp. Patient provided with handout reviewing treatment course and side effects and advised to call or message [place] on MyChart with any concerns.  Reviewed course of treatment and expected reaction.  Patient advised to expect inflammation and crusting and advised that erosions are possible.  Patient advised to be diligent with sun protection during and after treatment. Counseled to keep medication out of reach of children and pets.  Melanoma ABCDEs  Melanoma is the most dangerous type of skin cancer, and is the leading cause of death from skin disease.  You are more likely to develop melanoma if you: Have light-colored skin, light-colored eyes, or red or blond hair Spend a lot of time in the sun Tan regularly, either outdoors or in a tanning bed Have had blistering sunburns, especially during childhood Have a close family member who has had a melanoma Have atypical moles or large birthmarks  Early detection of melanoma is key since treatment is typically straightforward and cure rates are extremely high if we catch it early.   The first sign of melanoma is often a change in a mole or a new dark spot.  The ABCDE system is a way of remembering the signs of melanoma.  A for asymmetry:  The two halves do not match. B for border:  The edges of the growth are irregular. C for color:  A mixture of colors are present instead of an even brown color. D for diameter:  Melanomas are usually (but not always) greater than 6mm - the size of a pencil eraser. E for evolution:  The spot keeps changing in size, shape, and color.  Please check your skin once per month between visits. You can use a small mirror in front and a large mirror behind you to keep an eye on  the back side or your body.   If you see any new or changing lesions before your next follow-up, please call to schedule a visit.  Please continue daily skin protection including broad spectrum sunscreen SPF 30+ to sun-exposed areas, reapplying every 2 hours as needed when you're outdoors.    Due to recent changes in healthcare laws, you may see results of your pathology and/or laboratory studies on MyChart before the doctors have had a chance to review them. We understand that in some cases there may be results that are confusing or concerning to you. Please understand that not all results are received at the same time and often the doctors may need to interpret multiple results in order to provide you with the best plan of care or course of treatment. Therefore, we ask that you please give [place] 2 business days to thoroughly review all your results before contacting the office for clarification. Should we see a critical lab result, you will be contacted sooner.   If You Need Anything After Your Visit  If you have any questions or concerns for your doctor, please call our main line at [phone] and press option 4 to reach your doctor's medical assistant. If no one answers, please leave a voicemail as directed and we will return your call as soon as possible. Messages left after 4 pm will be answered the following business day.   You may also send [place] a message via  MyChart. We typically respond to MyChart messages within 1-2 business days.  For prescription refills, please ask your pharmacy to contact our office. Our fax number is [phone].  If you have an urgent issue when the clinic is closed that cannot wait until the next business day, you can page your doctor at the number below.    Please note that while we do our best to be available for urgent issues outside of office hours, we are not available 24/7.   If you have an urgent issue and are unable to reach [place], you may choose to seek  medical care at your doctor's office, retail clinic, urgent care center, or emergency room.  If you have a medical emergency, please immediately call 911 or go to the emergency department.  Pager Numbers  - Dr. Gwen Pounds: [phone]  - Dr. Roseanne Reno: [phone]  - Dr. Katrinka Blazing: [phone]   In the event of inclement weather, please call our main line at [phone] for an update on the status of any delays or closures.  Dermatology Medication Tips: Please keep the boxes that topical medications come in in order to help keep track of the instructions about where and how to use these. Pharmacies typically print the medication instructions only on the boxes and not directly on the medication tubes.   If your medication is too expensive, please contact our office at [phone] option 4 or send [place] a message through MyChart.   We are unable to tell what your co-pay for medications will be in advance as this is different depending on your insurance coverage. However, we may be able to find a substitute medication at lower cost or fill out paperwork to get insurance to cover a needed medication.   If a prior authorization is required to get your medication covered by your insurance company, please allow [place] 1-2 business days to complete this process.  Drug prices often vary depending on where the prescription is filled and some pharmacies may offer cheaper prices.  The website www.goodrx.com contains coupons for medications through different pharmacies. The prices here do not account for what the cost may be with help from insurance (it may be cheaper with your insurance), but the website can give you the price if you did not use any insurance.  - You can print the associated coupon and take it with your prescription to the pharmacy.  - You may also stop by our office during regular business hours and pick up a GoodRx coupon card.  - If you need your prescription sent electronically to a  different pharmacy, notify our office through [facility] or by phone at [phone] option 4.     Si Usted Necesita Algo Despus de Su Visita  Tambin puede enviarnos un mensaje a travs de Clinical cytogeneticist. Por lo general respondemos a los mensajes de MyChart en el transcurso de 1 a 2 das hbiles.  Para renovar recetas, por favor pida a su farmacia que se ponga en contacto con nuestra oficina. Annie Sable de fax es [place] [phone].  Si tiene un asunto urgente cuando la clnica est cerrada y que no puede esperar hasta el siguiente da hbil, puede llamar/localizar a su doctor(a) al nmero que aparece a continuacin.   Por favor, tenga en cuenta que aunque hacemos todo lo posible para estar disponibles para asuntos urgentes fuera del horario de [place], no estamos disponibles las 24 horas del da, los 7 [address]  [address] de la [place].   Si tiene un problema urgente y  no puede comunicarse con nosotros, puede optar por buscar atencin mdica  en el consultorio de su doctor(a), en una clnica privada, en un centro de atencin urgente o en una sala de emergencias.  Si tiene Engineer, drilling, por favor llame inmediatamente al 911 o vaya a la sala de emergencias.  Nmeros de bper  - Dr. Gwen Pounds: [phone]  - Dra. Roseanne Reno: [phone]  - Dr. Katrinka Blazing: [phone]   En caso de inclemencias del tiempo, por favor llame a Lacy Duverney principal al [phone] para una actualizacin sobre el [place] de cualquier retraso o cierre.  Consejos para la medicacin en dermatologa: Por favor, guarde las cajas en las que vienen los medicamentos de uso tpico para ayudarle a seguir las instrucciones sobre dnde y cmo usarlos. Las farmacias generalmente imprimen las instrucciones del medicamento slo en las cajas y no directamente en los tubos del [place].   Si su medicamento es muy caro, por favor, pngase en contacto con Rolm Gala llamando al [phone] y presione la opcin 4 o envenos un  mensaje a travs de Clinical cytogeneticist.   No podemos decirle cul ser su copago por los medicamentos por adelantado ya que esto es diferente dependiendo de la cobertura de su seguro. Sin embargo, es posible que podamos encontrar un medicamento sustituto a Audiological scientist un formulario para que el seguro cubra el medicamento que se considera necesario.   Si se requiere una autorizacin previa para que su compaa de seguros [place] su medicamento, por favor permtanos de 1 a 2 das hbiles para completar [address]  Los precios de los medicamentos varan con frecuencia dependiendo del Environmental consultant de dnde se surte la receta y alguna farmacias pueden ofrecer precios ms baratos.  El sitio web www.goodrx.com tiene cupones para medicamentos de Health and safety inspector. Los precios aqu no tienen en cuenta lo que podra costar con la ayuda del seguro (puede ser ms barato con su seguro), pero el sitio web puede darle el precio si no utiliz Tourist information centre manager.  - Puede imprimir el cupn correspondiente y llevarlo con su receta a la farmacia.  - Tambin puede pasar por nuestra oficina durante el horario de atencin regular y Education officer, museum una tarjeta de cupones de GoodRx.  - Si necesita que su receta se enve electrnicamente a una farmacia diferente, informe a nuestra oficina a travs de MyChart de [place] o por telfono llamando al [phone] y presione la opcin 4.

## 2023-03-24 NOTE — Progress Notes (Signed)
Follow-Up Visit   Subjective  Ronald Jacobson is a 82 y.o. male who presents for the following: Skin Cancer Screening and Full Body Skin Exam  The patient presents for Total-Body Skin Exam (TBSE) for skin cancer screening and mole check. The patient has spots, moles and lesions to be evaluated, some may be new or changing and the patient may have concern these could be cancer.  Hx AK's. Patient has treated with 5FU.   The following portions of the chart were reviewed this encounter and updated as appropriate: medications, allergies, medical history  Review of Systems:  No other skin or systemic complaints except as noted in HPI or Assessment and Plan.  Objective  Well appearing patient in no apparent distress; mood and affect are within normal limits.  A full examination was performed including scalp, head, eyes, ears, nose, lips, neck, chest, axillae, abdomen, back, buttocks, bilateral upper extremities, bilateral lower extremities, hands, feet, fingers, toes, fingernails, and toenails. All findings within normal limits unless otherwise noted below.   Relevant physical exam findings are noted in the Assessment and Plan.  Scalp x 5 (5) Erythematous thin papules/macules with gritty scale.  Left Upper Back Erythematous stuck-on, waxy papule or plaque  Assessment & Plan   SKIN CANCER SCREENING PERFORMED TODAY.  ACTINIC DAMAGE WITH PRECANCEROUS ACTINIC KERATOSES Counseling for Topical Chemotherapy Management: Patient exhibits: - Severe, confluent actinic changes with pre-cancerous actinic keratoses that is secondary to cumulative UV radiation exposure over time - Condition that is severe; chronic, not at goal. - diffuse scaly erythematous macules and papules with underlying dyspigmentation - Discussed Prescription "Field Treatment" topical Chemotherapy for Severe, Chronic Confluent Actinic Changes with Pre-Cancerous Actinic Keratoses Field treatment involves treatment of an  entire area of skin that has confluent Actinic Changes (Sun/ Ultraviolet light damage) and PreCancerous Actinic Keratoses by method of PhotoDynamic Therapy (PDT) and/or prescription Topical Chemotherapy agents such as 5-fluorouracil, 5-fluorouracil/calcipotriene, and/or imiquimod.  The purpose is to decrease the number of clinically evident and subclinical PreCancerous lesions to prevent progression to development of skin cancer by chemically destroying early precancer changes that may or may not be visible.  It has been shown to reduce the risk of developing skin cancer in the treated area. As a result of treatment, redness, scaling, crusting, and open sores may occur during treatment course. One or more than one of these methods may be used and may have to be used several times to control, suppress and eliminate the PreCancerous changes. Discussed treatment course, expected reaction, and possible side effects. - Recommend daily broad spectrum sunscreen SPF 30+ to sun-exposed areas, reapply every 2 hours as needed.  - Staying in the shade or wearing long sleeves, sun glasses (UVA+UVB protection) and wide brim hats (4-inch brim around the entire circumference of the hat) are also recommended. - Call for new or changing lesions.  Starting in January - - Start 5-fluorouracil/calcipotriene cream twice a day for 7-10 days to affected areas including top of scalp. Patient provided with handout reviewing treatment course and side effects and advised to call or message [place] on MyChart with any concerns.  Reviewed course of treatment and expected reaction.  Patient advised to expect inflammation and crusting and advised that erosions are possible.  Patient advised to be diligent with sun protection during and after treatment. Counseled to keep medication out of reach of children and pets.   LENTIGINES, SEBORRHEIC KERATOSES, HEMANGIOMAS - Benign normal skin lesions - Benign-appearing - Call for any  changes  MELANOCYTIC NEVI - Tan-brown and/or pink-flesh-colored symmetric macules and papules - Benign appearing on exam today - Observation - Call clinic for new or changing moles - Recommend daily use of broad spectrum spf 30+ sunscreen to sun-exposed areas.       AK (ACTINIC KERATOSIS) (5) Scalp x 5 (5) Destruction of lesion - Scalp x 5 (5)  Destruction method: cryotherapy   Informed consent: discussed and consent obtained   Lesion destroyed using liquid nitrogen: Yes   Cryotherapy cycles:  2 Outcome: patient tolerated procedure well with no complications   Post-procedure details: wound care instructions given   Related Medications fluorouracil (EFUDEX) 5 % cream Apply twice a day for 7 days to affected areas including scalp. INFLAMED SEBORRHEIC KERATOSIS Left Upper Back Symptomatic, irritating, patient would like treated.  Benign-appearing.  Call clinic for new or changing lesions.   Destruction of lesion - Left Upper Back  Destruction method: cryotherapy   Informed consent: discussed and consent obtained   Lesion destroyed using liquid nitrogen: Yes   Cryotherapy cycles:  2 Outcome: patient tolerated procedure well with no complications   Post-procedure details: wound care instructions given   Return in about 1 year (around 03/23/2024) for TBSE, with Dr. Kirtland Bouchard, Hx AK.  Anise Salvo, RMA, am acting as scribe for Armida Sans, MD .   Documentation: I have reviewed the above documentation for accuracy and completeness, and I agree with the above.  Armida Sans, MD

## 2023-03-27 ENCOUNTER — Encounter: Payer: Self-pay | Admitting: Dermatology

## 2023-04-20 DIAGNOSIS — M1711 Unilateral primary osteoarthritis, right knee: Secondary | ICD-10-CM | POA: Diagnosis not present

## 2023-04-20 DIAGNOSIS — N183 Chronic kidney disease, stage 3 unspecified: Secondary | ICD-10-CM | POA: Diagnosis not present

## 2023-04-20 DIAGNOSIS — R7303 Prediabetes: Secondary | ICD-10-CM | POA: Diagnosis not present

## 2023-04-20 DIAGNOSIS — I129 Hypertensive chronic kidney disease with stage 1 through stage 4 chronic kidney disease, or unspecified chronic kidney disease: Secondary | ICD-10-CM | POA: Diagnosis not present

## 2023-04-20 DIAGNOSIS — M25561 Pain in right knee: Secondary | ICD-10-CM | POA: Diagnosis not present

## 2023-05-10 DIAGNOSIS — M25561 Pain in right knee: Secondary | ICD-10-CM | POA: Diagnosis not present

## 2023-05-10 DIAGNOSIS — M109 Gout, unspecified: Secondary | ICD-10-CM | POA: Diagnosis not present

## 2023-05-10 DIAGNOSIS — G8929 Other chronic pain: Secondary | ICD-10-CM | POA: Diagnosis not present

## 2023-05-19 DIAGNOSIS — E785 Hyperlipidemia, unspecified: Secondary | ICD-10-CM | POA: Diagnosis not present

## 2023-05-19 DIAGNOSIS — N1831 Chronic kidney disease, stage 3a: Secondary | ICD-10-CM | POA: Diagnosis not present

## 2023-05-19 DIAGNOSIS — R7303 Prediabetes: Secondary | ICD-10-CM | POA: Diagnosis not present

## 2023-05-19 DIAGNOSIS — E790 Hyperuricemia without signs of inflammatory arthritis and tophaceous disease: Secondary | ICD-10-CM | POA: Diagnosis not present

## 2023-05-26 DIAGNOSIS — R7303 Prediabetes: Secondary | ICD-10-CM | POA: Diagnosis not present

## 2023-05-26 DIAGNOSIS — N1831 Chronic kidney disease, stage 3a: Secondary | ICD-10-CM | POA: Diagnosis not present

## 2023-05-26 DIAGNOSIS — Z125 Encounter for screening for malignant neoplasm of prostate: Secondary | ICD-10-CM | POA: Diagnosis not present

## 2023-05-26 DIAGNOSIS — E785 Hyperlipidemia, unspecified: Secondary | ICD-10-CM | POA: Diagnosis not present

## 2023-05-26 DIAGNOSIS — I129 Hypertensive chronic kidney disease with stage 1 through stage 4 chronic kidney disease, or unspecified chronic kidney disease: Secondary | ICD-10-CM | POA: Diagnosis not present

## 2023-05-26 DIAGNOSIS — E790 Hyperuricemia without signs of inflammatory arthritis and tophaceous disease: Secondary | ICD-10-CM | POA: Diagnosis not present

## 2023-05-27 DIAGNOSIS — M79672 Pain in left foot: Secondary | ICD-10-CM | POA: Diagnosis not present

## 2023-05-31 DIAGNOSIS — E790 Hyperuricemia without signs of inflammatory arthritis and tophaceous disease: Secondary | ICD-10-CM | POA: Diagnosis not present

## 2023-05-31 DIAGNOSIS — I10 Essential (primary) hypertension: Secondary | ICD-10-CM | POA: Diagnosis not present

## 2023-06-10 DIAGNOSIS — Z03818 Encounter for observation for suspected exposure to other biological agents ruled out: Secondary | ICD-10-CM | POA: Diagnosis not present

## 2023-06-16 DIAGNOSIS — J209 Acute bronchitis, unspecified: Secondary | ICD-10-CM | POA: Diagnosis not present

## 2023-06-16 DIAGNOSIS — J101 Influenza due to other identified influenza virus with other respiratory manifestations: Secondary | ICD-10-CM | POA: Diagnosis not present

## 2023-06-16 DIAGNOSIS — R053 Chronic cough: Secondary | ICD-10-CM | POA: Diagnosis not present

## 2023-09-19 DIAGNOSIS — M542 Cervicalgia: Secondary | ICD-10-CM | POA: Diagnosis not present

## 2023-09-19 DIAGNOSIS — N1831 Chronic kidney disease, stage 3a: Secondary | ICD-10-CM | POA: Diagnosis not present

## 2023-09-19 DIAGNOSIS — I129 Hypertensive chronic kidney disease with stage 1 through stage 4 chronic kidney disease, or unspecified chronic kidney disease: Secondary | ICD-10-CM | POA: Diagnosis not present

## 2023-09-29 DIAGNOSIS — H2513 Age-related nuclear cataract, bilateral: Secondary | ICD-10-CM | POA: Diagnosis not present

## 2023-09-29 DIAGNOSIS — B880 Other acariasis: Secondary | ICD-10-CM | POA: Diagnosis not present

## 2023-09-29 DIAGNOSIS — H43813 Vitreous degeneration, bilateral: Secondary | ICD-10-CM | POA: Diagnosis not present

## 2023-09-29 DIAGNOSIS — H01003 Unspecified blepharitis right eye, unspecified eyelid: Secondary | ICD-10-CM | POA: Diagnosis not present

## 2023-12-13 DIAGNOSIS — Z125 Encounter for screening for malignant neoplasm of prostate: Secondary | ICD-10-CM | POA: Diagnosis not present

## 2023-12-13 DIAGNOSIS — N1831 Chronic kidney disease, stage 3a: Secondary | ICD-10-CM | POA: Diagnosis not present

## 2023-12-13 DIAGNOSIS — I10 Essential (primary) hypertension: Secondary | ICD-10-CM | POA: Diagnosis not present

## 2023-12-13 DIAGNOSIS — R7303 Prediabetes: Secondary | ICD-10-CM | POA: Diagnosis not present

## 2023-12-13 DIAGNOSIS — E790 Hyperuricemia without signs of inflammatory arthritis and tophaceous disease: Secondary | ICD-10-CM | POA: Diagnosis not present

## 2023-12-13 DIAGNOSIS — E785 Hyperlipidemia, unspecified: Secondary | ICD-10-CM | POA: Diagnosis not present

## 2023-12-19 DIAGNOSIS — Z23 Encounter for immunization: Secondary | ICD-10-CM | POA: Diagnosis not present

## 2023-12-21 DIAGNOSIS — N1831 Chronic kidney disease, stage 3a: Secondary | ICD-10-CM | POA: Diagnosis not present

## 2023-12-21 DIAGNOSIS — I129 Hypertensive chronic kidney disease with stage 1 through stage 4 chronic kidney disease, or unspecified chronic kidney disease: Secondary | ICD-10-CM | POA: Diagnosis not present

## 2023-12-21 DIAGNOSIS — E785 Hyperlipidemia, unspecified: Secondary | ICD-10-CM | POA: Diagnosis not present

## 2023-12-21 DIAGNOSIS — Z0000 Encounter for general adult medical examination without abnormal findings: Secondary | ICD-10-CM | POA: Diagnosis not present

## 2023-12-21 DIAGNOSIS — Z1331 Encounter for screening for depression: Secondary | ICD-10-CM | POA: Diagnosis not present

## 2023-12-21 DIAGNOSIS — R7303 Prediabetes: Secondary | ICD-10-CM | POA: Diagnosis not present

## 2023-12-21 DIAGNOSIS — M1A9XX0 Chronic gout, unspecified, without tophus (tophi): Secondary | ICD-10-CM | POA: Diagnosis not present

## 2023-12-28 DIAGNOSIS — H6123 Impacted cerumen, bilateral: Secondary | ICD-10-CM | POA: Diagnosis not present

## 2023-12-28 DIAGNOSIS — D164 Benign neoplasm of bones of skull and face: Secondary | ICD-10-CM | POA: Diagnosis not present

## 2023-12-28 DIAGNOSIS — D3701 Neoplasm of uncertain behavior of lip: Secondary | ICD-10-CM | POA: Diagnosis not present

## 2024-03-28 ENCOUNTER — Ambulatory Visit: Payer: Medicare HMO | Admitting: Dermatology

## 2024-03-28 ENCOUNTER — Encounter: Payer: Self-pay | Admitting: Dermatology

## 2024-03-28 DIAGNOSIS — L578 Other skin changes due to chronic exposure to nonionizing radiation: Secondary | ICD-10-CM | POA: Diagnosis not present

## 2024-03-28 DIAGNOSIS — D229 Melanocytic nevi, unspecified: Secondary | ICD-10-CM

## 2024-03-28 DIAGNOSIS — L820 Inflamed seborrheic keratosis: Secondary | ICD-10-CM

## 2024-03-28 DIAGNOSIS — W908XXA Exposure to other nonionizing radiation, initial encounter: Secondary | ICD-10-CM | POA: Diagnosis not present

## 2024-03-28 DIAGNOSIS — L570 Actinic keratosis: Secondary | ICD-10-CM | POA: Diagnosis not present

## 2024-03-28 DIAGNOSIS — L821 Other seborrheic keratosis: Secondary | ICD-10-CM | POA: Diagnosis not present

## 2024-03-28 DIAGNOSIS — L905 Scar conditions and fibrosis of skin: Secondary | ICD-10-CM

## 2024-03-28 DIAGNOSIS — L814 Other melanin hyperpigmentation: Secondary | ICD-10-CM

## 2024-03-28 DIAGNOSIS — Z1283 Encounter for screening for malignant neoplasm of skin: Secondary | ICD-10-CM | POA: Diagnosis not present

## 2024-03-28 DIAGNOSIS — D1801 Hemangioma of skin and subcutaneous tissue: Secondary | ICD-10-CM

## 2024-03-28 NOTE — Progress Notes (Unsigned)
 Follow-Up Visit   Subjective  Ronald Jacobson is a 83 y.o. male who presents for the following: Skin Cancer Screening and Full Body Skin Exam  The patient presents for Total-Body Skin Exam (TBSE) for skin cancer screening and mole check. The patient has spots, moles and lesions to be evaluated, some may be new or changing and the patient may have concern these could be cancer.  The following portions of the chart were reviewed this encounter and updated as appropriate: medications, allergies, medical history  Review of Systems:  No other skin or systemic complaints except as noted in HPI or Assessment and Plan.  Objective  Well appearing patient in no apparent distress; mood and affect are within normal limits.  A full examination was performed including scalp, head, eyes, ears, nose, lips, neck, chest, axillae, abdomen, back, buttocks, bilateral upper extremities, bilateral lower extremities, hands, feet, fingers, toes, fingernails, and toenails. All findings within normal limits unless otherwise noted below.   Relevant physical exam findings are noted in the Assessment and Plan.  Scalp x 3 (3) Erythematous thin papules/macules with gritty scale.  L low back lat x 1 Erythematous stuck-on, waxy papule or plaque  Assessment & Plan   SKIN CANCER SCREENING PERFORMED TODAY.  ACTINIC DAMAGE - Chronic condition, secondary to cumulative UV/sun exposure - diffuse scaly erythematous macules with underlying dyspigmentation - Recommend daily broad spectrum sunscreen SPF 30+ to sun-exposed areas, reapply every 2 hours as needed.  - Staying in the shade or wearing long sleeves, sun glasses (UVA+UVB protection) and wide brim hats (4-inch brim around the entire circumference of the hat) are also recommended for sun protection.  - Call for new or changing lesions.  LENTIGINES, SEBORRHEIC KERATOSES, HEMANGIOMAS - Benign normal skin lesions - Benign-appearing - Call for any  changes  MELANOCYTIC NEVI - Tan-brown and/or pink-flesh-colored symmetric macules and papules - Benign appearing on exam today - Observation - Call clinic for new or changing moles - Recommend daily use of broad spectrum spf 30+ sunscreen to sun-exposed areas.  AK (ACTINIC KERATOSIS) (3) Scalp x 3 (3) Actinic keratoses are precancerous spots that appear secondary to cumulative UV radiation exposure/sun exposure over time. They are chronic with expected duration over 1 year. A portion of actinic keratoses will progress to squamous cell carcinoma of the skin. It is not possible to reliably predict which spots will progress to skin cancer and so treatment is recommended to prevent development of skin cancer.  Recommend daily broad spectrum sunscreen SPF 30+ to sun-exposed areas, reapply every 2 hours as needed.  Recommend staying in the shade or wearing long sleeves, sun glasses (UVA+UVB protection) and wide brim hats (4-inch brim around the entire circumference of the hat). Call for new or changing lesions.  - Destruction of lesion - Scalp x 3 (3) Complexity: simple   Destruction method: cryotherapy   Informed consent: discussed and consent obtained   Timeout:  patient name, date of birth, surgical site, and procedure verified Lesion destroyed using liquid nitrogen: Yes   Region frozen until ice ball extended beyond lesion: Yes   Outcome: patient tolerated procedure well with no complications   Post-procedure details: wound care instructions given    Existing Treatments - fluorouracil  (EFUDEX ) 5 % cream - Apply twice a day for 7 days to affected areas including scalp. INFLAMED SEBORRHEIC KERATOSIS L low back lat x 1 Symptomatic, irritating, patient would like treated.  Benign-appearing.  Call clinic for new or changing lesions.   - Destruction  of lesion - L low back lat x 1 Complexity: simple   Destruction method: cryotherapy   Informed consent: discussed and consent obtained    Timeout:  patient name, date of birth, surgical site, and procedure verified Lesion destroyed using liquid nitrogen: Yes   Region frozen until ice ball extended beyond lesion: Yes   Outcome: patient tolerated procedure well with no complications   Post-procedure details: wound care instructions given    SKIN CANCER SCREENING   ACTINIC SKIN DAMAGE   LENTIGO   MELANOCYTIC NEVUS, UNSPECIFIED LOCATION   SEBORRHEIC KERATOSIS   SCARRING    SCAR    Exam: 0.4 cm smooth hyperpigmented macule L lower lip vermillion Benign-appearing.  Observation.  Call clinic for new or changing lesions. Recommend daily broad spectrum sunscreen SPF 30+, reapply every 2 hours as needed. Treatment: Recommend Serica moisturizing scar formula cream every night or [facility] brand or Mederma silicone scar sheet every night for the first year after a scar appears to help with scar remodeling if desired. Scars remodel on their own for a full year and will gradually improve in appearance over time.   Return in about 1 year (around 03/28/2025) for TBSE - hx AK, ISK.  I, Rosina Mayans, CMA, am acting as scribe for Alm Rhyme, MD .   Documentation: I have reviewed the above documentation for accuracy and completeness, and I agree with the above.  Alm Rhyme, MD

## 2024-03-28 NOTE — Patient Instructions (Signed)

## 2025-03-28 ENCOUNTER — Ambulatory Visit: Admitting: Dermatology
